# Patient Record
Sex: Female | Born: 2000 | Race: Black or African American | Hispanic: No | Marital: Single | Smoking: Never smoker
Health system: Southern US, Community
[De-identification: ages and names within clinical notes are randomized; demographics above are authoritative.]

## PROBLEM LIST (undated history)

## (undated) DIAGNOSIS — D649 Anemia, unspecified: Secondary | ICD-10-CM

## (undated) DIAGNOSIS — J45909 Unspecified asthma, uncomplicated: Secondary | ICD-10-CM

## (undated) HISTORY — PX: TYMPANOSTOMY TUBE PLACEMENT: SHX32

## (undated) HISTORY — DX: Unspecified asthma, uncomplicated: J45.909

---
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
## 2021-04-29 ENCOUNTER — Ambulatory Visit: Payer: Medicaid Other | Admitting: Family Medicine

## 2021-05-13 ENCOUNTER — Ambulatory Visit (INDEPENDENT_AMBULATORY_CARE_PROVIDER_SITE_OTHER): Payer: Medicaid Other | Admitting: Family Medicine

## 2021-05-13 ENCOUNTER — Encounter: Payer: Self-pay | Admitting: Family Medicine

## 2021-05-13 ENCOUNTER — Other Ambulatory Visit: Payer: Self-pay

## 2021-05-13 VITALS — BP 94/62 | HR 62 | Ht 60.0 in | Wt 106.4 lb

## 2021-05-13 DIAGNOSIS — N926 Irregular menstruation, unspecified: Secondary | ICD-10-CM | POA: Diagnosis not present

## 2021-05-13 DIAGNOSIS — Z3201 Encounter for pregnancy test, result positive: Secondary | ICD-10-CM

## 2021-05-13 LAB — POCT URINE PREGNANCY: Preg Test, Ur: POSITIVE — AB

## 2021-05-13 NOTE — Patient Instructions (Signed)
It is early in your pregnancy as I calculate yu at 8 weeks and zero days with an estimated due dateof Dec 23, 2021 First Trimester of Pregnancy The first trimester of pregnancy starts on the first day of your last menstrual period until the end of week 12. This is months 1 through 3 of pregnancy. A week after a sperm fertilizes an egg, the egg will implant into the wall of the uterus and begin to develop into a baby. By the end of 12 weeks, all the baby's organs will be formed and the baby will be 2-3 inches in size. Body changes during your first trimester Your body goes through many changes during pregnancy. The changes vary and generally return to normal after your baby is born. Physical changes You may gain or lose weight. Your breasts may begin to grow larger and become tender. The tissue that surrounds your nipples (areola) may become darker. Dark spots or blotches (chloasma or mask of pregnancy) may develop on your face. You may have changes in your hair. These can include thickening or thinning of your hair or changes in texture. Health changes You may feel nauseous, and you may vomit. You may have heartburn. You may develop headaches. You may develop constipation. Your gums may bleed and may be sensitive to brushing and flossing. Other changes You may tire easily. You may urinate more often. Your menstrual periods will stop. You may have a loss of appetite. You may develop cravings for certain kinds of food. You may have changes in your emotions from day to day. You may have more vivid and strange dreams. Follow these instructions at home: Medicines Follow your health care provider's instructions regarding medicine use. Specific medicines may be either safe or unsafe to take during pregnancy. Do not take any medicines unless told to by your health care provider. Take a prenatal vitamin that contains at least 600 micrograms (mcg) of folic acid. Eating and drinking Eat a healthy  diet that includes fresh fruits and vegetables, whole grains, good sources of protein such as meat, eggs, or tofu, and low-fat dairy products. Avoid raw meat and unpasteurized juice, milk, and cheese. These carry germs that can harm you and your baby. If you feel nauseous or you vomit: Eat 4 or 5 small meals a day instead of 3 large meals. Try eating a few soda crackers. Drink liquids between meals instead of during meals. You may need to take these actions to prevent or treat constipation: Drink enough fluid to keep your urine pale yellow. Eat foods that are high in fiber, such as beans, whole grains, and fresh fruits and vegetables. Limit foods that are high in fat and processed sugars, such as fried or sweet foods. Activity Exercise only as directed by your health care provider. Most people can continue their usual exercise routine during pregnancy. Try to exercise for 30 minutes at least 5 days a week. Stop exercising if you develop pain or cramping in the lower abdomen or lower back. Avoid exercising if it is very hot or humid or if you are at high altitude. Avoid heavy lifting. If you choose to, you may have sex unless your health care provider tells you not to. Relieving pain and discomfort Wear a good support bra to relieve breast tenderness. Rest with your legs elevated if you have leg cramps or low back pain. If you develop bulging veins (varicose veins) in your legs: Wear support hose as told by your health care provider. Elevate  your feet for 15 minutes, 3-4 times a day. Limit salt in your diet. Safety Wear your seat belt at all times when driving or riding in a car. Talk with your health care provider if someone is verbally or physically abusive to you. Talk with your health care provider if you are feeling sad or have thoughts of hurting yourself. Lifestyle Do not use hot tubs, steam rooms, or saunas. Do not douche. Do not use tampons or scented sanitary pads. Do not use  herbal remedies, alcohol, illegal drugs, or medicines that are not approved by your health care provider. Chemicals in these products can harm your baby. Do not use any products that contain nicotine or tobacco, such as cigarettes, e-cigarettes, and chewing tobacco. If you need help quitting, ask your health care provider. Avoid cat litter boxes and soil used by cats. These carry germs that can cause birth defects in the baby and possibly loss of the unborn baby (fetus) by miscarriage or stillbirth. General instructions During routine prenatal visits in the first trimester, your health care provider will do a physical exam, perform necessary tests, and ask you how things are going. Keep all follow-up visits. This is important. Ask for help if you have counseling or nutritional needs during pregnancy. Your health care provider can offer advice or refer you to specialists for help with various needs. Schedule a dentist appointment. At home, brush your teeth with a soft toothbrush. Floss gently. Write down your questions. Take them to your prenatal visits. Where to find more information American Pregnancy Association: americanpregnancy.org [employer]: https://www.todd-brady.net/ Office on [employer] Health: MightyReward.co.nz Contact a health care provider if you have: Dizziness. A fever. Mild pelvic cramps, pelvic pressure, or nagging pain in the abdominal area. Nausea, vomiting, or diarrhea that lasts for 24 hours or longer. A bad-smelling vaginal discharge. Pain when you urinate. Known exposure to a contagious illness, such as chickenpox, measles, Zika virus, HIV, or hepatitis. Get help right away if you have: Spotting or bleeding from your vagina. Severe abdominal cramping or pain. Shortness of breath or chest pain. Any kind of trauma, such as from a fall or a car crash. New or increased pain, swelling, or redness in an arm or  leg. Summary The first trimester of pregnancy starts on the first day of your last menstrual period until the end of week 12 (months 1 through 3). Eating 4 or 5 small meals a day rather than 3 large meals may help to relieve nausea and vomiting. Do not use any products that contain nicotine or tobacco, such as cigarettes, e-cigarettes, and chewing tobacco. If you need help quitting, ask your health care provider. Keep all follow-up visits. This is important. This information is not intended to replace advice given to you by your health care provider. Make sure you discuss any questions you have with your health care provider. Document Revised: 01/16/2020 Document Reviewed: 11/22/2019 Elsevier Patient Education  2022 [employer].

## 2021-05-13 NOTE — Progress Notes (Signed)
urine

## 2021-05-14 DIAGNOSIS — Z3201 Encounter for pregnancy test, result positive: Secondary | ICD-10-CM | POA: Insufficient documentation

## 2021-05-14 LAB — OBSTETRIC PANEL, INCLUDING HIV
Antibody Screen: NEGATIVE
Basophils Absolute: 0 10*3/uL (ref 0.0–0.2)
Basos: 1 %
EOS (ABSOLUTE): 0.1 10*3/uL (ref 0.0–0.4)
Eos: 1 %
HIV Screen 4th Generation wRfx: NONREACTIVE
Hematocrit: 37.1 % (ref 34.0–46.6)
Hemoglobin: 12.1 g/dL (ref 11.1–15.9)
Hepatitis B Surface Ag: NEGATIVE
Immature Grans (Abs): 0 10*3/uL (ref 0.0–0.1)
Immature Granulocytes: 0 %
Lymphocytes Absolute: 1.2 10*3/uL (ref 0.7–3.1)
Lymphs: 17 %
MCH: 30.4 pg (ref 26.6–33.0)
MCHC: 32.6 g/dL (ref 31.5–35.7)
MCV: 93 fL (ref 79–97)
Monocytes Absolute: 0.6 10*3/uL (ref 0.1–0.9)
Monocytes: 8 %
Neutrophils Absolute: 5.4 10*3/uL (ref 1.4–7.0)
Neutrophils: 73 %
Platelets: 339 10*3/uL (ref 150–450)
RBC: 3.98 x10E6/uL (ref 3.77–5.28)
RDW: 11.8 % (ref 11.7–15.4)
RPR Ser Ql: NONREACTIVE
Rh Factor: POSITIVE
Rubella Antibodies, IGG: 5.72 index (ref >0.99)
WBC: 7.3 10*3/uL (ref 3.4–10.8)

## 2021-05-14 LAB — HCV INTERPRETATION

## 2021-05-14 LAB — HCV AB W REFLEX TO QUANT PCR: HCV Ab: <0.1 s/co ratio (ref 0.0–0.9)

## 2021-05-14 NOTE — Progress Notes (Signed)
    CHIEF COMPLAINT / HPI: Missed menstrual cycle.  Last menstrual cycle was July 27.  She took home pregnancy test which was positive.  She has started on prenatal vitamins that she bought the drugstore.  She had previously smoked some marijuana but as soon as she missed her cycle she has discontinued that practice.  She is not smoking cigarettes.  She is not drinking alcohol or using illicit drugs.  She is not taking anything over-the-counter except occasional Tylenol.  She is here with her partner and they are wanting to have her pregnancy care at this clinic.  She specifically tells me that none of the information about her health status should be discussed with her family or parents.   PERTINENT  PMH / PSH: I have reviewed the patient's medications, allergies, past medical and surgical history, smoking status and updated in the EMR as appropriate.   OBJECTIVE:  BP 94/62   Pulse 62   Ht 5' (1.524 m)   Wt 106 lb 6.4 oz (48.3 kg)   LMP 03/18/2021   SpO2 100%   BMI 20.78 kg/m  GENERAL: Well-developed female no acute distress POSITIVE pregnancy test.PSYCH: AxOx4. Good eye contact.. No psychomotor retardation or agitation. Appropriate speech fluency and content. Asks and answers questions appropriately. Mood is congruent.   ASSESSMENT / PLAN:   Positive pregnancy test Gave her handout and discussed first trimester pregnancy care especially illicit substances over-the-counter medications nausea etc.  She already has her prenatal vitamins so I would continue those.  I have set her up for initial OB appointment at this clinic.  We will get her OB labs today.  Set her up for ultrasound.  Congratulated her and her partner.  Reassured her that we will keep any of her health information confidential as is the customer.   Denny Levy MD

## 2021-05-14 NOTE — Assessment & Plan Note (Signed)
Gave her handout and discussed first trimester pregnancy care especially illicit substances over-the-counter medications nausea etc.  She already has her prenatal vitamins so I would continue those.  I have set her up for initial OB appointment at this clinic.  We will get her OB labs today.  Set her up for ultrasound.  Congratulated her and her partner.  Reassured her that we will keep any of her health information confidential as is the customer.

## 2021-05-17 LAB — URINE CULTURE, OB REFLEX

## 2021-05-17 LAB — CULTURE, OB URINE

## 2021-05-18 LAB — HGB FRACTIONATION CASCADE
Hgb A2: 2.9 % (ref 1.8–3.2)
Hgb A: 97.1 % (ref 96.4–98.8)
Hgb F: 0 % (ref 0.0–2.0)
Hgb S: 0 %

## 2021-05-25 NOTE — Progress Notes (Deleted)
Treat UTI 

## 2021-05-26 ENCOUNTER — Other Ambulatory Visit: Payer: Self-pay | Admitting: Family Medicine

## 2021-05-26 ENCOUNTER — Other Ambulatory Visit: Payer: Self-pay

## 2021-05-26 ENCOUNTER — Telehealth: Payer: Self-pay | Admitting: Family Medicine

## 2021-05-26 ENCOUNTER — Ambulatory Visit
Admission: RE | Admit: 2021-05-26 | Discharge: 2021-05-26 | Disposition: A | Discharge status: Home / Self Care | Payer: Medicaid Other | Source: Ambulatory Visit | Attending: Family Medicine | Admitting: Family Medicine

## 2021-05-26 ENCOUNTER — Encounter: Payer: Medicaid Other | Admitting: Family Medicine

## 2021-05-26 DIAGNOSIS — Z3201 Encounter for pregnancy test, result positive: Secondary | ICD-10-CM

## 2021-05-26 DIAGNOSIS — O26891 Other specified pregnancy related conditions, first trimester: Secondary | ICD-10-CM | POA: Diagnosis not present

## 2021-05-26 DIAGNOSIS — Z3A10 10 weeks gestation of pregnancy: Secondary | ICD-10-CM | POA: Diagnosis not present

## 2021-05-26 MED ORDER — CEPHALEXIN 500 MG PO CAPS: 500.0000 mg | ORAL_CAPSULE | Freq: Three times a day (TID) | ORAL | 0 refills | Status: DC

## 2021-05-26 NOTE — Telephone Encounter (Signed)
Called patient to discuss missed initial prenatal visit.  She reports that she was unaware that she had an appointment.  She discussed that she was upset about events that occurred in her ultrasound.  We discussed that she did have a positive urine culture and needs to be treated for this for asymptomatic bacteria in pregnancy.  I sent a prescription for Keflex 3 times a day for 7 days to her pharmacy.  Patient reports she will go pick it up.  Verified correct pharmacy with the patient.

## 2021-06-10 ENCOUNTER — Encounter: Payer: Self-pay | Admitting: Family Medicine

## 2021-06-10 ENCOUNTER — Ambulatory Visit (INDEPENDENT_AMBULATORY_CARE_PROVIDER_SITE_OTHER): Payer: Medicaid Other | Admitting: Family Medicine

## 2021-06-10 ENCOUNTER — Other Ambulatory Visit (HOSPITAL_COMMUNITY)
Admission: RE | Admit: 2021-06-10 | Discharge: 2021-06-10 | Disposition: A | Discharge status: Home / Self Care | Payer: Medicaid Other | Source: Ambulatory Visit | Attending: Family Medicine | Admitting: Family Medicine

## 2021-06-10 ENCOUNTER — Other Ambulatory Visit: Payer: Self-pay

## 2021-06-10 VITALS — BP 102/58 | HR 88 | Wt 107.8 lb

## 2021-06-10 DIAGNOSIS — Z3401 Encounter for supervision of normal first pregnancy, first trimester: Secondary | ICD-10-CM

## 2021-06-10 DIAGNOSIS — Z3491 Encounter for supervision of normal pregnancy, unspecified, first trimester: Secondary | ICD-10-CM | POA: Insufficient documentation

## 2021-06-10 DIAGNOSIS — N898 Other specified noninflammatory disorders of vagina: Secondary | ICD-10-CM | POA: Diagnosis not present

## 2021-06-10 DIAGNOSIS — Z3492 Encounter for supervision of normal pregnancy, unspecified, second trimester: Secondary | ICD-10-CM | POA: Insufficient documentation

## 2021-06-10 DIAGNOSIS — Z3493 Encounter for supervision of normal pregnancy, unspecified, third trimester: Secondary | ICD-10-CM | POA: Insufficient documentation

## 2021-06-10 LAB — POCT WET PREP (WET MOUNT)
Clue Cells Wet Prep Whiff POC: NEGATIVE
Trichomonas Wet Prep HPF POC: ABSENT

## 2021-06-10 MED ORDER — ASPIRIN EC 81 MG PO TBEC: 81.0000 mg | DELAYED_RELEASE_TABLET | Freq: Every day | ORAL | 11 refills | Status: DC

## 2021-06-10 MED ORDER — TERCONAZOLE 0.4 % VA CREA: 1.0000 | TOPICAL_CREAM | Freq: Every day | VAGINAL | 0 refills | Status: DC

## 2021-06-10 NOTE — Progress Notes (Signed)
Patient Name: Tammie Edwards Date of Birth: 05/27/2001 [place] Initial Prenatal Visit  Tammie Edwards is a 20 y.o. year old No obstetric history on file. at Unknown who presents for her initial prenatal visit. Pregnancy is planned She reports breast tenderness, frequent urination, and positive home pregnancy test. She is taking a prenatal vitamin.  She denies pelvic pain or vaginal bleeding.   Pregnancy Dating: The patient is dated by LMP.  LMP: 38/25/0539 Period is certain:  Yes.  Periods were regular:  Yes.  LMP was a typical period:  Yes.  Using hormonal contraception in 3 months prior to conception: No  Lab Review: Blood type: O Rh Status: + Antibody screen: Negative HIV: Negative RPR: Negative Hemoglobin electrophoresis reviewed: Yes Results of OB urine culture are: Positive for E coli and was treated. TOC obtained today.  Rubella: Immune Hep C Ab: Negative Varicella status is Unknown  PMH: Reviewed and as detailed below: HTN: No  Gestational Hypertension/preeclampsia: No  Type 1 or 2 Diabetes: No  Depression:  Yes , is not on medications for it.  Seizure disorder:  No VTE: No ,  History of STI No,  Abnormal Pap smear:  No, Has had one at age 29 y/o which was normal Genital herpes simplex:  No   PSH: Gynecologic Surgery:  no Surgical history reviewed, notable for: tympanostomy tubes   Obstetric History: Obstetric history tab updated and reviewed.  Summary of prior pregnancies: NA Cesarean delivery: No  Gestational Diabetes:  No Hypertension in pregnancy: No History of preterm birth: No History of LGA/SGA infant:  No History of shoulder dystocia: No Indications for referral were reviewed, and the patient has no obstetric indications for referral to [facility] at this time.   Social History: Partner's name: Einar Crow   Tobacco use: No Alcohol use:  No Other substance use:  No, marijuana prior to pregnancy but quit after  missed period   Current Medications:  Prenatal Vitamin   Reviewed and appropriate in pregnancy.   Genetic and Infection Screen: Flow Sheet Updated Yes  Prenatal Exam: Gen: Well nourished, well developed.  No distress.  Vitals noted. HEENT: Normocephalic, atraumatic.  Neck supple without cervical lymphadenopathy, thyromegaly or thyroid nodules.  Fair dentition. CV: RRR no murmur, gallops or rubs Lungs: CTA B.  Normal respiratory effort without wheezes or rales. Abd: soft, NTND. +BS.  Uterus not appreciated above pelvis. GU: Normal external female genitalia without lesions.  Nl vaginal, well rugated without lesions.  Moderate thick vaginal discharge  Ext: No clubbing, cyanosis or edema. Psych: Normal grooming and dress.  Not depressed or anxious appearing.  Normal thought content and process without flight of ideas or looseness of associations  Fetal heart tones: Appropriate 152 bpm  Assessment/Plan:  Tammie Edwards is a 20 y.o. No obstetric history on file. at Unknown who presents to initiate prenatal care. She is doing well.  Current pregnancy issues include none.  Routine prenatal care: As dating is reliable, a dating ultrasound has been completed Dating tab updated. Pre-pregnancy weight updated. Expected weight gain this pregnancy is 25-35 pounds  Prenatal labs reviewed, notable for E Coli UTI treated and TOC obtained today. Indications for referral to [facility] were reviewed and the patient does not meet criteria for referral.  Medication list reviewed and updated.  Recommended patient see a dentist for regular care.  Bleeding and pain precautions reviewed. Importance of prenatal vitamins reviewed.  Genetic screening offered. Patient opted for: no  screening. The patient has the following indications for aspirinto begin 81 mg at 12-16 weeks: One high risk condition: no single high risk condition  MORE than one moderate risk condition: nulliparity and identifies as African American   Aspirin was  recommended today based upon above risk factors (one high risk condition or more than one moderate risk factor)  The patient will not be age 66 or over at time of delivery. Referral to genetic counseling was not offered today.  The patient has the following risk factors for preexisting diabetes: Reviewed indications for early 1 hour glucose testing, not indicated . An early 1 hour glucose tolerance test was not ordered. Pregnancy Medical Home and PHQ-9 forms completed, problems noted: No  2. Pregnancy issues include the following which were addressed today:  GC/chlamydia collected today, will send patient a MyChart message if they are normal and call the patient if they are abnormal. During pelvic exam noted thick vaginal discharge, wet prep positive for yeast infection, treated with terconazole for 7 days Initial OB urine culture grew E. coli, she was treated and completed her treatment 1 week ago, test of cure collected today Patient met criteria for 81 mg aspirin due to nulliparity and African-American, prescription sent to patient's pharmacy Plan on scheduling patient's anatomy scan at next visit    Follow up scheduled in 4 weeks for next prenatal visit.

## 2021-06-10 NOTE — Patient Instructions (Addendum)
It was wonderful to meet you today.  Congratulations on the pregnancy!  I have no concerns at this time.  Had I recommend you continue taking your prenatal vitamin as well as starting aspirin.  I have sent a prescription for this to your pharmacy.  We have scheduled you for an appointment in 1 month which will be your next OB appointment.  At that visit we will schedule your anatomy ultrasound.  We ran a a few routine tests today and I will send those results to your MyChart but if there are any abnormalities I will call you.  If you have any vaginal bleeding, gush of fluid, feel contractions please go be assessed at the maternal assessment unit in the bottom of the [facility].  If you have any questions please feel free to call the clinic.  I hope you have a wonderful afternoon and I will see you in a month.  Safe Medications in Pregnancy   Acne:  Benzoyl Peroxide  Salicylic Acid   Backache/Headache:  Tylenol: 2 regular strength every 4 hours OR               2 Extra strength every 6 hours   Colds/Coughs/Allergies:  Benadryl (alcohol free) 25 mg every 6 hours as needed  Breath right strips  Claritin  Cepacol throat lozenges  Chloraseptic throat spray  Cold-Eeze- up to three times per day  Cough drops, alcohol free  Flonase (by prescription only)  Guaifenesin  Mucinex  Robitussin DM (plain only, alcohol free)  Saline nasal spray/drops  Sudafed (pseudoephedrine) & Actifed * use only after [redacted] weeks gestation and if you do not have high blood pressure  Tylenol  Vicks Vaporub  Zinc lozenges  Zyrtec   Constipation:  Colace  Ducolax suppositories  Fleet enema  Glycerin suppositories  Metamucil  Milk of magnesia  Miralax  Senokot  Smooth move tea   Diarrhea:  Kaopectate  Imodium A-D   *NO pepto Bismol   Hemorrhoids:  Anusol  Anusol HC  Preparation H  Tucks   Indigestion:  Tums  Maalox  Mylanta  Zantac  Pepcid   Insomnia:  Benadryl (alcohol free) 25mg  every  6 hours as needed  Tylenol PM  Unisom, no Gelcaps   Leg Cramps:  Tums  MagGel   Nausea/Vomiting:  Bonine  Dramamine  Emetrol  Ginger extract  Sea bands  Meclizine  Nausea medication to take during pregnancy:  Unisom (doxylamine succinate 25 mg tablets) Take one tablet daily at bedtime. If symptoms are not adequately controlled, the dose can be increased to a maximum recommended dose of two tablets daily (1/2 tablet in the morning, 1/2 tablet mid-afternoon and one at bedtime).  Vitamin B6 100mg  tablets. Take one tablet twice a day (up to 200 mg per day).   Skin Rashes:  Aveeno products  Benadryl cream or 25mg  every 6 hours as needed  Calamine Lotion  1% cortisone cream   Yeast infection:  Gyne-lotrimin 7  Monistat 7    **If taking multiple medications, please check labels to avoid duplicating the same active ingredients  **take medication as directed on the label  ** Do not exceed 4000 mg of tylenol in 24 hours  **Do not take medications that contain aspirin or ibuprofen

## 2021-06-11 LAB — CERVICOVAGINAL ANCILLARY ONLY
Chlamydia: NEGATIVE
Comment: NEGATIVE
Comment: NORMAL
Neisseria Gonorrhea: NEGATIVE

## 2021-06-13 LAB — URINE CULTURE, OB REFLEX

## 2021-06-13 LAB — CULTURE, OB URINE

## 2021-06-23 NOTE — Progress Notes (Deleted)
  Patient Name: Tammie Edwards Date of Birth: 11-01-2000 [facility] Prenatal Visit  DONNETTA GILLIN is a 20 y.o. G1P0 at [redacted]w[redacted]d here for routine follow up. She is dated by LMP.  She reports {symptoms; pregnancy related:14538}.  She denies vaginal bleeding.  See flow sheet for details.  There were no vitals filed for this visit.   A/P: Pregnancy at [redacted]w[redacted]d.  Doing well.    Routine Prenatal Care:  Dating reviewed, dating tab is {correct:23336::"correct"} Fetal heart tones {appropriate:23337} Influenza vaccine {given:23340}  COVID vaccination was discussed and ***.  The patient has the following indication for screening preexisting diabetes: Reviewed indications for early 1 hour glucose testing, not indicated . Anatomy ultrasound ordered to be scheduled at 18-20 weeks. Patient {is/is not:9024} interested in genetic screening. As she is past 13 weeks and 6 days, a {quad:23339::"Quad screen "} was offered.  Pregnancy education including expected weight gain in pregnancy, OTC medication use, continued use of prenatal vitamin, smoking cessation if applicable, and nutrition in pregnancy.   Bleeding and pain precautions reviewed. The patient has the following indications for aspirin to begin 81 mg at 12-16 weeks: One high risk condition: no single high risk condition  MORE than one moderate risk condition: nulliparity and identifies as African American  Aspirin was  recommended today based upon above risk factors (one high risk condition or more than one moderate risk factor) - already prescribed  2. Pregnancy issues include the following and were addressed as appropriate today:  Vaginal candidiasis: patient had yeast on wet prep at visit 2 weeks ago, treated with terconazole. Today she reoports*** Asymptomatic GBS bacteriuria found on initial OB urine culture. Patient completed tx with keflex. Repeat OB urine culture obtained today for TOC. However, she will need ppx abx at time of  delivery. Problem list  and pregnancy box updated: {yes/no:20286::"Yes"}.   Follow up 2 weeks.

## 2021-07-10 ENCOUNTER — Ambulatory Visit (INDEPENDENT_AMBULATORY_CARE_PROVIDER_SITE_OTHER): Payer: Medicaid Other | Admitting: Family Medicine

## 2021-07-10 ENCOUNTER — Other Ambulatory Visit: Payer: Self-pay

## 2021-07-10 VITALS — BP 100/61 | HR 85 | Wt 110.2 lb

## 2021-07-10 DIAGNOSIS — Z3401 Encounter for supervision of normal first pregnancy, first trimester: Secondary | ICD-10-CM

## 2021-07-10 NOTE — Patient Instructions (Signed)
It was wonderful seeing you today.  I am glad you are doing well and I have no concerns at this time.  We scheduled your anatomy ultrasound.  You will need to be seen in our clinic in 4 weeks.  If you have any bleeding, contractions, gush of fluid between now and then please seek medical attention at the maternal assessment unit.  If you have any questions or concerns call the clinic.  I hope you have a wonderful afternoon!

## 2021-07-10 NOTE — Progress Notes (Signed)
  Patient Name: Tammie Edwards Date of Birth: 04/07/2001 [facility] Prenatal Visit  Tammie Edwards is a 20 y.o. G1P0 at [redacted]w[redacted]d here for routine follow up. She is dated by LMP.  She reports no bleeding, no contractions, no cramping, and no leaking.  She denies vaginal bleeding.  See flow sheet for details.  Vitals:   07/10/21 1453  BP: 100/61  Pulse: 85     A/P: Pregnancy at 108w2d.  Doing well.    Routine Prenatal Care:  Dating reviewed, dating tab is correct Fetal heart tones Appropriate 150 Influenza vaccine not administered as patient declined, will continue to discuss.   COVID vaccination was discussed and patient has decided not to get it at this time.  We will continue to discuss..  The patient has the following indication for screening preexisting diabetes: Reviewed indications for early 1 hour glucose testing, not indicated . Anatomy ultrasound ordered to be scheduled at 18-20 weeks. Patient is not interested in genetic screening.  Pregnancy education including expected weight gain in pregnancy, OTC medication use, continued use of prenatal vitamin, smoking cessation if applicable, and nutrition in pregnancy.   Bleeding and pain precautions reviewed. The patient has the following indications for aspirinto begin 81 mg at 12-16 weeks: One high risk condition: no single high risk condition  MORE than one moderate risk condition: nulliparity and identifies as African American  Aspirin was  recommended today based upon above risk factors (one high risk condition or more than one moderate risk factor)   2. Pregnancy issues include the following and were addressed as appropriate today:  Low risk pregnancy, next visit scheduled for 12/19 Anatomy scan scheduled for 12/9 Patient not yet taking aspirin, recommended to take aspirin daily for PE prevention. Counseled on flu vaccine and patient declined, will continue to have this conversation Counseled on COVID-19  booster and patient declined we will continue to discuss at future visits Patient will need to be scheduled for second trimester faculty OB clinic at next visit GBS bacteria-patient will need prophylaxis during labor Problem list  and pregnancy box updated: Yes.   Follow up 4 weeks.

## 2021-07-31 ENCOUNTER — Other Ambulatory Visit: Payer: Self-pay | Admitting: Family Medicine

## 2021-07-31 ENCOUNTER — Other Ambulatory Visit: Payer: Self-pay

## 2021-07-31 ENCOUNTER — Ambulatory Visit: Payer: Medicaid Other | Attending: Family Medicine

## 2021-07-31 DIAGNOSIS — Z3A19 19 weeks gestation of pregnancy: Secondary | ICD-10-CM | POA: Diagnosis not present

## 2021-07-31 DIAGNOSIS — Z363 Encounter for antenatal screening for malformations: Secondary | ICD-10-CM | POA: Insufficient documentation

## 2021-07-31 DIAGNOSIS — Z3401 Encounter for supervision of normal first pregnancy, first trimester: Secondary | ICD-10-CM | POA: Insufficient documentation

## 2021-07-31 DIAGNOSIS — O35BXX0 Maternal care for other (suspected) fetal abnormality and damage, fetal cardiac anomalies, not applicable or unspecified: Secondary | ICD-10-CM | POA: Insufficient documentation

## 2021-07-31 DIAGNOSIS — Z3689 Encounter for other specified antenatal screening: Secondary | ICD-10-CM | POA: Diagnosis not present

## 2021-08-10 ENCOUNTER — Encounter: Payer: Medicaid Other | Admitting: Family Medicine

## 2021-08-10 NOTE — Progress Notes (Deleted)
°  Patient Name: Tammie Edwards Date of Birth: Apr 28, 2001 [facility] Prenatal Visit  Tammie Edwards is a 20 y.o. G1P0 at 103w5d here for routine follow up. She is dated by LMP.  She reports {symptoms; pregnancy related:14538}.  She denies vaginal bleeding.  See flow sheet for details.  There were no vitals filed for this visit.   A/P: Pregnancy at [redacted]w[redacted]d.  Doing well.    Routine Prenatal Care:  Dating reviewed, dating tab is correct Fetal heart tones {appropriate:23337} Influenza vaccine {given:23340}  COVID vaccination was discussed and ***.  The patient has the following indication for screening preexisting diabetes: Reviewed indications for early 1 hour glucose testing, not indicated . Anatomy ultrasound ordered to be scheduled at 18-20 weeks. Patient is not interested in genetic screening. Pregnancy education including expected weight gain in pregnancy, OTC medication use, continued use of prenatal vitamin, smoking cessation if applicable, and nutrition in pregnancy.   Bleeding and pain precautions reviewed. The patient has the following indications for aspirinto begin 81 mg at 12-16 weeks: One high risk condition: {fmcaspirinobhigh:26167} MORE than one moderate risk condition: {fmcaspirinobmoderate:26168} Aspirin {WAS/WAS [phone]::"was not"}  recommended today based upon above risk factors (one high risk condition or more than one moderate risk factor)   2. Pregnancy issues include the following and were addressed as appropriate today:  Echogenic intracardiac foci noticed on anatomy scan.  Patient was counseled by maternal-fetal medicine doctors regarding further testing and they declined any further testing including cell free DNA. Patient to continue taking aspirin daily for preeclampsia prevention.   Patient scheduled for second trimester OB faculty clinic Patient has GBS bacteriuria and will need prophylaxis during labor. Problem list  and pregnancy box  updated: {yes/no:20286::"Yes"}.   Follow up 4 weeks.

## 2021-08-23 NOTE — L&D Delivery Note (Addendum)
OB/GYN Faculty Practice Delivery Note ? ?Tammie Edwards is a 21 y.o. G1P0 s/p SVD at [redacted]w[redacted]d. She was admitted for SOL.  ? ?ROM: 0h 27m with clear fluid ?GBS Status:  Positive/-- (04/28 0000) ?Maximum Maternal Temperature:  Temp (24hrs), Avg:98.1 ?F (36.7 ?C), Min:97.8 ?F (36.6 ?C), Max:98.6 ?F (37 ?C) ? ?Labor Progress: ?Patient arrived at 2 cm dilation. She then progressed to complete largely with expectant management and AROM at 9cm.  ? ?Delivery Date/Time: 12/18/2021 at 2239 ?Delivery: Called to room and patient was complete and pushing. Pushed for about 15 minutes. Head delivered in LOA position. No nuchal cord present. Shoulder and body delivered in usual fashion. Infant with spontaneous cry, placed on mother's abdomen, dried and stimulated. Cord clamped x 2 after 1-minute delay, and cut by FOB. Cord blood drawn. Placenta delivered spontaneously with gentle cord traction. Fundus firm with massage and Pitocin. Labia, perineum, vagina, and cervix inspected with left labial tear.  ? ?Placenta: spontaneous, intact, 3 vessel cord  ?Complications: none  ?Lacerations: left labial repaired with 4-0 Monocryl ?EBL: 75 ml  ?Analgesia: epidural   ? ?Infant: ?APGAR (1 MIN): 9   ?APGAR (5 MINS): 9   ? ?Weight: pending  ? ?Derrel Nip, MD  ?PGY-3, [facility]  ?12/18/2021 11:03 PM ? ?ATTESTATION ? ?I was present, gloved, and supervising throughout the delivery and agree with above documentation in the resident's note. ? ?Allayne Stack, DO ?OB Fellow  ?[facility] [employer] [facility]) ?12/18/2021, 11:09 PM   ?

## 2021-08-27 ENCOUNTER — Other Ambulatory Visit: Payer: Self-pay

## 2021-08-27 ENCOUNTER — Ambulatory Visit (INDEPENDENT_AMBULATORY_CARE_PROVIDER_SITE_OTHER): Payer: Medicaid Other | Admitting: Family Medicine

## 2021-08-27 DIAGNOSIS — Z3402 Encounter for supervision of normal first pregnancy, second trimester: Secondary | ICD-10-CM

## 2021-08-27 NOTE — Progress Notes (Signed)
°  [place] Prenatal Visit  Tammie Edwards is a 21 y.o. G1P0 at [redacted]w[redacted]d here for routine follow up. She is dated by LMP.  She reports no bleeding, no contractions, no cramping, and no leaking.  She reports good fetal movement. No bleeding, loss of fluid, contractions. See flow sheet for details. Vitals:   08/27/21 1613  BP: 111/66  Pulse: 74     A/P: Pregnancy at [redacted]w[redacted]d.  Doing well.   Dating reviewed, dating tab is correct Fetal heart tones Appropriate 150 Fundal height within expected range.  22 cm Anatomy ultrasound reviewed and notable for EIF.  Influenza vaccine not administered as patient declined, will continue to discuss. Marland Kitchen  COVID vaccination was discussed and declined at this time.  Indications for screening for preexisting diabetes include: Reviewed indications for early 1 hour glucose testing, not indicated .  Pregnancy education provided on the following topics: fetal growth and movement, ultrasound assessment, and upcoming laboratory assessment.   The patient has the following indications for aspirinto begin 81 mg at 12-16 weeks: One high risk condition: no single high risk condition  MORE than one moderate risk condition: nulliparity and identifies as African American  Aspirin was  recommended today based upon above risk factors (one high risk condition or more than one moderate risk factor)  Scheduled for Faculty Ob Clinic during second trimester on 09/10/21. Preterm labor precautions given.   2. Pregnancy issues include the following and were addressed as appropriate today:   Low risk pregnancy, scheduled for faculty OB visit on 1/19 Anatomy scan has been completed, EIF on anatomy scan, parents declined further testing Patient currently taking aspirin as well as prenatal vitamin, encouraged to continue these Counseled patient on the flu vaccine and patient declined, will continue to discuss Counseled patient on the COVID-vaccine and they declined, will  continue to discuss GBS bacteria-patient will need prophylaxis during labor  Problem list and pregnancy box updated: Yes.     Follow up 4 weeks.

## 2021-08-27 NOTE — Patient Instructions (Signed)
It was wonderful seeing you today!  I am glad everything is going well.  I have no concerns at this time.  Please continue taking your prenatal vitamin as well as your 81 mg aspirin daily.  If you have any vaginal bleeding, contractions, gush of fluid please be evaluated at the maternal assessment unit in the bottom of the [facility].  You are scheduled for your next appointment on 1/19 with her [facility] faculty clinic.  You will need your 1 hour glucose tolerance test at that visit so please be sure to show up a little early so that they can complete that that day.  If you have any questions or concerns call the clinic.  I hope you have a wonderful afternoon!

## 2021-08-28 ENCOUNTER — Encounter: Payer: Self-pay | Admitting: Family Medicine

## 2021-08-28 DIAGNOSIS — O283 Abnormal ultrasonic finding on antenatal screening of mother: Secondary | ICD-10-CM | POA: Insufficient documentation

## 2021-09-10 ENCOUNTER — Ambulatory Visit (INDEPENDENT_AMBULATORY_CARE_PROVIDER_SITE_OTHER): Payer: Medicaid Other | Admitting: Family Medicine

## 2021-09-10 ENCOUNTER — Other Ambulatory Visit: Payer: Self-pay

## 2021-09-10 DIAGNOSIS — O99891 Other specified diseases and conditions complicating pregnancy: Secondary | ICD-10-CM

## 2021-09-10 DIAGNOSIS — R8271 Bacteriuria: Secondary | ICD-10-CM

## 2021-09-10 DIAGNOSIS — J4520 Mild intermittent asthma, uncomplicated: Secondary | ICD-10-CM

## 2021-09-10 DIAGNOSIS — Z3402 Encounter for supervision of normal first pregnancy, second trimester: Secondary | ICD-10-CM

## 2021-09-10 DIAGNOSIS — J45909 Unspecified asthma, uncomplicated: Secondary | ICD-10-CM | POA: Insufficient documentation

## 2021-09-10 NOTE — Progress Notes (Signed)
°  [place] Prenatal Visit  Tammie Edwards is a 21 y.o. G1P0 at [redacted]w[redacted]d here for routine follow up. She is dated by LMP.  She reports no complaints. She reports fetal movement. Denies vaginal bleeding, loss of fluid, or contractions.  See flow sheet for details.  A/P: Pregnancy at [redacted]w[redacted]d.  Doing well.   Dating reviewed, dating tab is correct Fetal heart tones Appropriate Fundal height within expected range.  Influenza vaccine not administered as patient declined, will continue to discuss.  Patient reports a history of allergic reaction to flu vaccination.  COVID vaccination was discussed and patient reports having previously received vaccination and booster.  Screening for gestational diabetes to be completed at 28wk visit  Pregnancy education completed including: fetal growth, breastfeeding, contraception, and expected weight gain in pregnancy.   The patient does not have a history of Cesarean delivery and no referral to Center for [facility] is indicated Preterm labor, bleeding, and pain precautions given.    2. Pregnancy issues include the following and were addressed as appropriate today:   Discussed finding of EICF on [place] and pt declines further genetic screening        Will need GTT, CBC, RPR, HIV and Tdap at 28wk visit  Problem list and pregnancy box updated: Yes.   Follow up 4 weeks.

## 2021-09-10 NOTE — Patient Instructions (Signed)
Pregnancy Related Return Precautions The follow are signs/symptoms that are abnormal in pregnancy and may require further evaluation by a physician: Go to the MAU at [facility] if: You have cramping/contractions that do not go away with drinking water, especially if they are lasting 30 seconds to 1.5 minutes, coming and going every 5-10 minutes for an hour or more, or are getting stronger and you cannot walk or talk while having a contraction/cramp. Your water breaks.  Sometimes it is a big gush of fluid, sometimes it is just a trickle that keeps getting your underwear wet or running down your legs You have vaginal bleeding.    You do not feel your baby moving like normal.  If you do not, get something to eat and drink (something cold or something with sugar like peanut butter or juice) and lay down and focus on feeling your baby move. If your baby is still not moving like normal, you should go to MAU. You should feel your baby move 6 times in one hour, or 10 times in two hours. You have a persistent headache that does not go away with 1 g of Tylenol, vision changes, chest pain, difficulty breathing, severe pain in your right upper abdomen, worsening leg swelling- these can all be signs of high blood pressure in pregnancy and need to be evaluated by a provider immediately  These are all concerning in pregnancy and if you have any of these I recommend you call your PCP and present to the Maternity Admissions Unit (map below) for further evaluation.  For any pregnancy-related emergencies, please go to the Maternity Admissions Unit in the [facility]. You will use hospital Entrance C.    

## 2021-10-20 ENCOUNTER — Encounter: Payer: Self-pay | Admitting: Family Medicine

## 2021-10-30 ENCOUNTER — Ambulatory Visit (INDEPENDENT_AMBULATORY_CARE_PROVIDER_SITE_OTHER): Payer: Medicaid Other | Admitting: Family Medicine

## 2021-10-30 ENCOUNTER — Other Ambulatory Visit: Payer: Self-pay

## 2021-10-30 VITALS — BP 100/60 | HR 74 | Wt 126.0 lb

## 2021-10-30 DIAGNOSIS — Z3402 Encounter for supervision of normal first pregnancy, second trimester: Secondary | ICD-10-CM | POA: Diagnosis not present

## 2021-10-30 DIAGNOSIS — O26843 Uterine size-date discrepancy, third trimester: Secondary | ICD-10-CM | POA: Diagnosis not present

## 2021-10-30 DIAGNOSIS — Z23 Encounter for immunization: Secondary | ICD-10-CM | POA: Diagnosis not present

## 2021-10-30 LAB — POCT 1 HR PRENATAL GLUCOSE: Glucose 1 Hr Prenatal, POC: 112 mg/dL

## 2021-10-30 NOTE — Patient Instructions (Addendum)
It was wonderful to meet you today. Thank you for allowing me to be a part of your care. Below is a short summary of what we discussed at your visit today: ? ?Prenatal care ?Your next visit will be in 2 weeks. I have scheduled many of your necessary future OB appointments. See the next page of this paperwork for appointment details. Please call the front desk if any of the days or times do not work for you.  ? ?We scheduled you for your growth check ultrasound today. The nurse will give you your appointment card before you leave. The information will be available on your MyChart app, too.  ? ?Prenatal Classes ?Go to [email or web address] for more information on the pregnancy and child birth classes that [place] has to offer.  ? ?Emergency Planning ?If you experience any vaginal bleeding, leakage of fluids, don't feel your baby moving as much, or start to have contractions less than 5 minutes apart please go directly to the Maternal Assessment Unit at [facility] for evaluation. ? ?Maternity and women's care services located on the [place] side of The [place]. [facility] (Entrance C off [address]  ?[address] Entrance C ?[place],  [place]  [address] ? ?  ? ?Maternal Mental Health ?If you start to develop the below symptoms of depression, please reach out to [place] for an appointment. There is also a Biomedical scientist Health Hotline at [phone]). This hotline has trained counselors, doulas, and midwifes to real-time support, information, and resources.  ?Feeling sad or hopeless most of the time ?Lack of interest in things you used to enjoy ?Less interest in caring for yourself (dressing, fixing hair) ?Trouble concentrating ?Trouble coping with daily tasks ?Constant worry about your baby ?Sleeping or eating too much or too little ?Feeling very anxious or nervous ?Unexplained irritability or anger ?Unwanted or scary  thoughts ?Feeling that you are not a good mother ?Thoughts of hurting yourself or your baby ? ?If you feel you are experiencing a mental health crisis, please reach out to the National Suicide Prevention Hotline at 1-800-273-TALK (702)185-4573) or go directly to the [facility], open 24/7.  ?[phone] ?[address], [place], [place] [address]  ? ?Please bring all of your medications to every appointment! ? ?If you have any questions or concerns, please do not hesitate to contact us via phone or MyChart message.  ? ?Fayette Pho, MD  ?

## 2021-10-30 NOTE — Progress Notes (Signed)
?  [facility] Prenatal Visit ? ?MONICA CODD is a 21 y.o. G1P0 at [redacted]w[redacted]d here for routine follow up. She is dated by LMP.  She reports no complaints.  She reports fetal movement. She denies vaginal bleeding, contractions, or loss of fluid.  See flow sheet for details. ? ?Vitals:  ? 10/30/21 0836  ?BP: 100/60  ?Pulse: 74  ? ?A/P: Pregnancy at [redacted]w[redacted]d.  Doing well.   ?Routine prenatal care:  ?Dating reviewed, dating tab is correct ?Fetal heart tones: Appropriate - 150s bpm ?Fundal height: >2 cm from expected size given dating, discussed with preceptor.  Measured 28-29 cm but currently at 32.[redacted] weeks gestation. Will order [place].  ?The patient does not have a history of HSV and valacyclovir is not indicated at this time.  ?The patient does not have a history of Cesarean delivery and no referral to Center for [facility] is indicated ?Infant feeding choice: Both  ?Contraception choice: Undecided  ?Infant circumcision desired not applicable ?Influenza vaccine not administered as patient declined, will continue to discuss.   ?Tdap was given today. ?COVID vaccination was discussed and declined by patient.  ?Childbirth and education classes were offered, see hand out for more.  ?Pregnancy education regarding benefits of breastfeeding, contraception, fetal growth, expected weight gain, and safe infant sleep were discussed.  ?Preterm labor and fetal movement precautions reviewed. ? ? ?2. Pregnancy issues include the following and were addressed as appropriate today: ? Not taking aspirin as prescribed. Patient declined discussion.  ?Is taking prenatal vitamin ?Declines COVID and flu today ?Did get TDaP today ?Today did 1 hour GGT ?Drew CBC, HIV, RPR ?Ordered growth [place] for size-date discrepancy ? ? Problem list and pregnancy box updated: Yes.  ? ?Scheduled for Ob Faculty clinic in third trimester on 11/26/2021.  ? ?Follow up 2 weeks. All future prenatal visits through [redacted] weeks gestation scheduled today. May clinic  schedule not yet available for booking.  ? ?Fayette Pho, MD ? ?

## 2021-10-30 NOTE — Assessment & Plan Note (Signed)
Measured 28-29 cm but currently at 32.[redacted] weeks gestation. Will order [place].  ?

## 2021-10-31 LAB — HIV ANTIBODY (ROUTINE TESTING W REFLEX): HIV Screen 4th Generation wRfx: NONREACTIVE

## 2021-10-31 LAB — CBC
Hematocrit: 28.4 % — ABNORMAL LOW (ref 34.0–46.6)
Hemoglobin: 9.5 g/dL — ABNORMAL LOW (ref 11.1–15.9)
MCH: 29.6 pg (ref 26.6–33.0)
MCHC: 33.5 g/dL (ref 31.5–35.7)
MCV: 89 fL (ref 79–97)
Platelets: 237 10*3/uL (ref 150–450)
RBC: 3.21 x10E6/uL — ABNORMAL LOW (ref 3.77–5.28)
RDW: 12.1 % (ref 11.7–15.4)
WBC: 6.6 10*3/uL (ref 3.4–10.8)

## 2021-10-31 LAB — RPR: RPR Ser Ql: NONREACTIVE

## 2021-11-02 ENCOUNTER — Other Ambulatory Visit: Payer: Self-pay | Admitting: Family Medicine

## 2021-11-02 ENCOUNTER — Encounter: Payer: Self-pay | Admitting: Family Medicine

## 2021-11-02 DIAGNOSIS — O99013 Anemia complicating pregnancy, third trimester: Secondary | ICD-10-CM

## 2021-11-02 MED ORDER — FERROUS SULFATE 325 (65 FE) MG PO TABS: 325.0000 mg | ORAL_TABLET | Freq: Every day | ORAL | 1 refills | Status: DC

## 2021-11-02 NOTE — Progress Notes (Signed)
Hgb 9.5, dx anemia. Rx ferrous sulfate 325 mg daily.  ?Ezequiel Essex, MD ? ?

## 2021-11-05 ENCOUNTER — Encounter: Payer: Self-pay | Admitting: Family Medicine

## 2021-11-05 ENCOUNTER — Encounter: Payer: Self-pay | Admitting: *Deleted

## 2021-11-05 ENCOUNTER — Ambulatory Visit: Payer: Medicaid Other | Attending: Family Medicine

## 2021-11-05 ENCOUNTER — Ambulatory Visit: Payer: Medicaid Other | Admitting: *Deleted

## 2021-11-05 ENCOUNTER — Other Ambulatory Visit: Payer: Self-pay

## 2021-11-05 VITALS — BP 108/71 | HR 77

## 2021-11-05 DIAGNOSIS — Z3A33 33 weeks gestation of pregnancy: Secondary | ICD-10-CM | POA: Insufficient documentation

## 2021-11-05 DIAGNOSIS — O283 Abnormal ultrasonic finding on antenatal screening of mother: Secondary | ICD-10-CM | POA: Insufficient documentation

## 2021-11-05 DIAGNOSIS — Z3402 Encounter for supervision of normal first pregnancy, second trimester: Secondary | ICD-10-CM | POA: Insufficient documentation

## 2021-11-05 DIAGNOSIS — O358XX0 Maternal care for other (suspected) fetal abnormality and damage, not applicable or unspecified: Secondary | ICD-10-CM | POA: Insufficient documentation

## 2021-11-05 DIAGNOSIS — O99013 Anemia complicating pregnancy, third trimester: Secondary | ICD-10-CM | POA: Diagnosis not present

## 2021-11-05 DIAGNOSIS — O26843 Uterine size-date discrepancy, third trimester: Secondary | ICD-10-CM | POA: Diagnosis present

## 2021-11-05 NOTE — Progress Notes (Signed)
Result note. [place] for size-date discrepancy, [place] normal.  ?Fayette Pho, MD ? ?

## 2021-11-11 ENCOUNTER — Ambulatory Visit (INDEPENDENT_AMBULATORY_CARE_PROVIDER_SITE_OTHER): Payer: Medicaid Other | Admitting: Family Medicine

## 2021-11-11 ENCOUNTER — Other Ambulatory Visit: Payer: Self-pay

## 2021-11-11 DIAGNOSIS — Z3493 Encounter for supervision of normal pregnancy, unspecified, third trimester: Secondary | ICD-10-CM

## 2021-11-11 NOTE — Patient Instructions (Signed)
It was wonderful to see you today. Thank you for allowing me to be a part of your care. Below is a short summary of what we discussed at your visit today: ? ?Pregnancy ?Everything is looking good! Baby's heart rate was normal. And baby is head down still! Come back in about 2 weeks for your next visit at 36 weeks of pregnancy. After that, your visits will be weekly.  ? ?Continue to take your iron supplementation for the anemia.  ? ?Continue to take your aspirin for pre-eclampsia prevention.  ? ?Prenatal Classes ?Go to [email or web address] for more information on the pregnancy and child birth classes that [place] has to offer.  ? ?Emergency Planning ?If you experience any vaginal bleeding, leakage of fluids, don't feel your baby moving as much, or start to have contractions less than 5 minutes apart please go directly to the Maternal Assessment Unit at [facility] for evaluation. ? ?Maternity and women's care services located on the [place] side of The [place]. [facility] (Entrance C off [address]  ?[address] Entrance [address] ?[place],  [place]  [address] ? ?  ? ?Maternal Mental Health ?If you start to develop the below symptoms of depression, please reach out to [place] for an appointment. There is also a [place] at [phone]). This hotline has trained counselors, doulas, and midwifes to real-time support, information, and resources.  ?Feeling sad or hopeless most of the time ?Lack of interest in things you used to enjoy ?Less interest in caring for yourself (dressing, fixing hair) ?Trouble concentrating ?Trouble coping with daily tasks ?Constant worry about your baby ?Sleeping or eating too much or too little ?Feeling very anxious or nervous ?Unexplained irritability or anger ?Unwanted or scary thoughts ?Feeling that you are not a good mother ?Thoughts of hurting yourself or your baby ? ?If you feel  you are experiencing a mental health crisis, please reach out to the [facility] at 1-800-273-TALK (307)491-0272) or go directly to the [facility], open 24/7.  ?[phone] ?[address], [place],  [address]  ? ? ?Please bring all of your medications to every appointment! ? ?If you have any questions or concerns, please do not hesitate to contact us via phone or MyChart message.  ? ?Ezequiel Essex, MD  ?

## 2021-11-11 NOTE — Progress Notes (Signed)
?  [facility] Prenatal Visit ? ?Tammie Edwards is a 21 y.o. G1P0 at [redacted]w[redacted]d here for routine follow up. She is dated by LMP.  She reports no complaints.  She reports fetal movement. She denies vaginal bleeding, contractions, or loss of fluid.  See flow sheet for details. ? ?Vitals:  ? 11/11/21 0858  ?BP: 99/69  ?Pulse: 75  ? ?A/P: Pregnancy at [redacted]w[redacted]d.  Doing well.   ?Routine prenatal care:  ?Dating reviewed, dating tab is correct ?Fetal heart tones: Appropriate - 135-137 bpm ?Fundal height: within expected range.  - 31-32 cm (at last visit was noted to measure smaller than dates, subsequent [place] normal) ?The patient does not have a history of HSV and valacyclovir is not indicated at this time.  ?The patient does not have a history of Cesarean delivery and no referral to Center for [facility] is indicated ?Infant feeding choice: Both  ?Contraception choice: Undecided  ?Infant circumcision desired not applicable ?Influenza vaccine not administered as patient is allergic ?Tdap was not given today - given at last appointment ?COVID vaccination was discussed and declined. Patient has received initial series and booster previously.  ?Childbirth and education classes were offered - see AVS ?Preterm labor and fetal movement precautions reviewed. ? ? ?2. Pregnancy issues include the following and were addressed as appropriate today: ? Continue asa 81 mg  ?Prenatal anemia - continue iron supplementation ?Declined COVID today, continue to offer ?Previous 1 hour GTT normal ?Previous HIV and RPR negative ? Problem list and pregnancy box updated: Yes.  ? ?Scheduled for Ob Faculty clinic in third trimester on 11/26/2021.  ? ?Follow up 2 weeks.  ? ?Fayette Pho, MD ? ?

## 2021-11-25 ENCOUNTER — Encounter: Payer: Medicaid Other | Admitting: Family Medicine

## 2021-11-26 ENCOUNTER — Other Ambulatory Visit: Payer: Self-pay

## 2021-11-26 ENCOUNTER — Other Ambulatory Visit (HOSPITAL_COMMUNITY)
Admission: RE | Admit: 2021-11-26 | Discharge: 2021-11-26 | Disposition: A | Discharge status: Home / Self Care | Payer: Medicaid Other | Source: Ambulatory Visit | Attending: Family Medicine | Admitting: Family Medicine

## 2021-11-26 ENCOUNTER — Ambulatory Visit (INDEPENDENT_AMBULATORY_CARE_PROVIDER_SITE_OTHER): Payer: Medicaid Other | Admitting: Family Medicine

## 2021-11-26 VITALS — BP 106/72 | HR 91 | Wt 128.0 lb

## 2021-11-26 DIAGNOSIS — O99013 Anemia complicating pregnancy, third trimester: Secondary | ICD-10-CM | POA: Diagnosis not present

## 2021-11-26 DIAGNOSIS — Z3493 Encounter for supervision of normal pregnancy, unspecified, third trimester: Secondary | ICD-10-CM | POA: Insufficient documentation

## 2021-11-26 LAB — OB RESULTS CONSOLE GC/CHLAMYDIA: Gonorrhea: NEGATIVE

## 2021-11-26 MED ORDER — ALBUTEROL SULFATE HFA 108 (90 BASE) MCG/ACT IN AERS: INHALATION_SPRAY | RESPIRATORY_TRACT | 1 refills | Status: AC

## 2021-11-26 NOTE — Patient Instructions (Addendum)
It was nice to meet you today! ? ?Refilled albuterol ?Check out https://www.bedsider.org/ for information on birth control ?Testing for infections today ?Also checking blood counts ? ?Go to the MAU at [facility] if: ?You begin to have strong, frequent contractions ?Your water breaks.  Sometimes it is a big gush of fluid, sometimes it is just a trickle that keeps getting your underwear wet or running down your legs ?You have vaginal bleeding.  It is normal to have a small amount of spotting if your cervix was checked.  ?You do not feel your baby moving like normal.  If you do not, get something to eat and drink and lay down and focus on feeling your baby move.   If your baby is still not moving like normal, you should go to MAU.  ? ?Be well, ?Dr. Pollie Meyer  ?

## 2021-11-26 NOTE — Progress Notes (Signed)
[facility] ?Faculty OB Clinic Visit ? ?Tammie Edwards is a 21 y.o. G1P0 at [redacted]w[redacted]d (via LMP=10w sono) who presents to [facility] OB Clinic for routine follow up. Prenatal course, history, notes, ultrasounds, and laboratory results reviewed. ? ?Denies cramping/ctx, fluid leaking, vaginal bleeding, or decreased fetal movement. Taking PNV.   ? ?Primary Prenatal Care Provider: Drs. Cresenzo/Lynn ? ?Postpartum Plans: ?- delivery planning: SVD ?- circumcision: n/a ?- feeding: breast & formula ?- pediatrician: undecided; will likely go to doctor in [place] since she lives there ?- contraception: declines ? ?BP 106/72 ?FHR: 145bpm ?Uterine size: 32.5cm ? ?Assessment & Plan ? ?1. Routine prenatal care: ?- gc/chlamydia/trich collected today ?- fetal presentation confirmed vertex via leopolds and informal bedside u/s ?- provided bedsider.org website for info on birth control methods, discussed importance of birth spacing ?- encouraged COVID booster, declines at this time ?- previously listed as varicella immunity unknown but I was able to confirm prior vaccination in [place] ?- encouraged childbirth and breastfeeding classes ? ?2. GBS bacteriuria - 1000 colonies earlier in pregnancy ?- needs intrapartum antibiotics, no PCN allergy ? ?3. Anemia - never started iron, was 9.5 at last check ?- recheck CBC today to monitor, along w/ ferritin ? ?4. Size-date discrepancy - persists but FH is appropriately rising, prior growth u/s normal, normal AFI ?- continue to monitor ? ?5. Asthma - not needing albuterol at present but does not have inhaler ?- refill inhaler ? ?Next prenatal visit in 1 week. Labor & fetal movement precautions discussed. ? ?Levert Feinstein, MD ?[facility]  ?

## 2021-11-27 ENCOUNTER — Encounter: Payer: Self-pay | Admitting: Family Medicine

## 2021-11-27 LAB — CERVICOVAGINAL ANCILLARY ONLY
Chlamydia: NEGATIVE
Comment: NEGATIVE
Comment: NEGATIVE
Comment: NORMAL
Neisseria Gonorrhea: NEGATIVE
Trichomonas: POSITIVE — AB

## 2021-11-30 ENCOUNTER — Encounter: Payer: Self-pay | Admitting: Family Medicine

## 2021-11-30 DIAGNOSIS — A5901 Trichomonal vulvovaginitis: Secondary | ICD-10-CM | POA: Insufficient documentation

## 2021-11-30 LAB — FERRITIN

## 2021-11-30 LAB — CBC
Hematocrit: 28 % — ABNORMAL LOW (ref 34.0–46.6)
Hemoglobin: 9.5 g/dL — ABNORMAL LOW (ref 11.1–15.9)
MCH: 28.8 pg (ref 26.6–33.0)
MCHC: 33.9 g/dL (ref 31.5–35.7)
MCV: 85 fL (ref 79–97)
Platelets: 253 10*3/uL (ref 150–450)
RBC: 3.3 x10E6/uL — ABNORMAL LOW (ref 3.77–5.28)
RDW: 12.6 % (ref 11.7–15.4)
WBC: 6.9 10*3/uL (ref 3.4–10.8)

## 2021-11-30 MED ORDER — METRONIDAZOLE 500 MG PO TABS: 500.0000 mg | ORAL_TABLET | Freq: Two times a day (BID) | ORAL | 0 refills | Status: DC

## 2021-11-30 MED ORDER — FERROUS SULFATE 324 (65 FE) MG PO TBEC: 1.0000 | DELAYED_RELEASE_TABLET | ORAL | 1 refills | Status: DC

## 2021-11-30 NOTE — Telephone Encounter (Signed)
Contacted patient via phone to discuss results. ?Advised positive trichomonas, explained it is an STI and partner needs to be treated as well. ?Should abstain from intercourse for 7 days after both she and her partner are treated in order to prevent reinfection. ? ?Also advised starting iron every other day since hgb remains low at 9.5 (patient never started iron previously). ? ?Will send in rx for flagyl and iron. ? ?Patient appreciative, all questions answered. Has follow up in place with Dr. Miquel Dunn for OB visit in 2 days. ? ?Latrelle Dodrill, MD  ?

## 2021-12-02 ENCOUNTER — Ambulatory Visit (INDEPENDENT_AMBULATORY_CARE_PROVIDER_SITE_OTHER): Payer: Medicaid Other | Admitting: Family Medicine

## 2021-12-02 VITALS — BP 110/65 | HR 95 | Wt 128.5 lb

## 2021-12-02 DIAGNOSIS — Z3402 Encounter for supervision of normal first pregnancy, second trimester: Secondary | ICD-10-CM

## 2021-12-02 NOTE — Progress Notes (Signed)
[facility] ?Faculty OB Clinic Visit ? ?Tammie Edwards is a 21 y.o. G1P0 at [redacted]w[redacted]d (via LMP c/w 10 wk sono) who presents for routine follow up. Prenatal course, history, notes, ultrasounds, and laboratory results reviewed. ? ?Denies cramping/ctx, fluid leaking, vaginal bleeding, or decreased fetal movement. Taking PNV.   ? ?Primary Prenatal Care Provider: Dr Sharlette Dense ? ?Postpartum Plans: ?- delivery planning: SVD ?- circumcision: n/a ?- feeding: breast and formula ?- pediatrician: undecided, [facility] ?- contraception: declines ? ?Vitals:  ? 12/02/21 0934  ?BP: 110/65  ?Pulse: 95  ? ? ? ?FHR: 141 ?Uterine size: 33.5 cm ? ?Assessment & Plan ? ?1. Routine prenatal care: ?- discussed labor precautions, carseat, cervical exams ? ?2. Trichomonas infection- on 11/26/21, given Flagyl 500mg  BID, has started and has a few days left. Abstaining from intercourse, partner has also been treated with the 2g one time yesterday. ? ?2. GBS bacteriuria - 1000 colonies earlier in pregnancy ?- needs intrapartum antibiotics, no PCN allergy ?  ?3. Anemia - Hgb 9.5 11/26/21, has started PO iron yesterday. Recheck in 1 month. ?  ?4. Size-date discrepancy - persists but FH is appropriately rising, prior growth u/s normal, normal AFI- continue to monitor ?  ?5. Asthma - not needing albuterol at present but does not have inhaler, refilled last visit ? ?Next prenatal visit in 1 weeks . Labor & fetal movement precautions discussed. ? ?01/26/22, MD ?[facility]  ? ?

## 2021-12-02 NOTE — Patient Instructions (Signed)
Please make a follow up appointment in 1 weeks.  Pregnancy Related Return Precautions The follow are signs/symptoms that are abnormal in pregnancy and may require further evaluation by a physician: Go to the MAU at [facility] [place] if: You have cramping/contractions that do not go away with drinking water, especially if they are lasting 30 seconds to 1.5 minutes, coming and going every 5-10 minutes for an hour or more, or are getting stronger and you cannot walk or talk while having a contraction/cramp. Your water breaks.  Sometimes it is a big gush of fluid, sometimes it is just a trickle that keeps getting your underwear wet or running down your legs You have vaginal bleeding.    You do not feel your baby moving like normal.  If you do not, get something to eat and drink (something cold or something with sugar like peanut butter or juice) and lay down and focus on feeling your baby move. If your baby is still not moving like normal, you should go to MAU. You should feel your baby move 6 times in one hour, or 10 times in two hours. You have a persistent headache that does not go away with 1 g of Tylenol, vision changes, chest pain, difficulty breathing, severe pain in your right upper abdomen, worsening leg swelling- these can all be signs of high blood pressure in pregnancy and need to be evaluated by a provider immediately  These are all concerning in pregnancy and if you have any of these I recommend you call your PCP and present to the Maternity Admissions Unit (map below) for further evaluation.  For any pregnancy-related emergencies, please go to the Maternity Admissions Unit in the [facility] at [facility]. You will use hospital Entrance C.    Our clinic number is [phone].   Dr Appollonia Klee  

## 2021-12-09 ENCOUNTER — Ambulatory Visit (INDEPENDENT_AMBULATORY_CARE_PROVIDER_SITE_OTHER): Payer: Medicaid Other | Admitting: Family Medicine

## 2021-12-09 DIAGNOSIS — Z3493 Encounter for supervision of normal pregnancy, unspecified, third trimester: Secondary | ICD-10-CM

## 2021-12-09 NOTE — Patient Instructions (Signed)
It was wonderful to see you today. Thank you for allowing me to be a part of your care. Below is a short summary of what we discussed at your visit today: ? ?If you experience any vaginal bleeding, leakage of fluids, don't feel your baby moving as much, or start to have contractions less than 5 minutes apart please go directly to the Maternal Assessment Unit at [facility] for evaluation. ? ?Maternity and women's care services located on the [place] side of [facility] [place]. [facility] (Entrance C off [address]  ?[address] Entr[address] ?[place],  [place]  [address] ? ?  ? ?Maternal Mental Health ?If you start to develop the below symptoms of depression, please reach out to [place] for an appointment. There is also a [place] at [phone]). This hotline has trained counselors, doulas, and midwifes to real-time support, information, and resources.  ?Feeling sad or hopeless most of the time ?Lack of interest in things you used to enjoy ?Less interest in caring for yourself (dressing, fixing hair) ?Trouble concentrating ?Trouble coping with daily tasks ?Constant worry about your baby ?Sleeping or eating too much or too little ?Feeling very anxious or nervous ?Unexplained irritability or anger ?Unwanted or scary thoughts ?Feeling that you are not a good mother ?Thoughts of hurting yourself or your baby ? ?If you feel you are experiencing a mental health crisis, please reach out to the [place] at 1-800-273-TALK [phone]) or go directly to the [facility], open 24/7.  ?[phone] ?[address], [place],  [address]  ? ?Please bring all of your medications to every appointment! ? ?If you have any questions or concerns, please do not hesitate to contact us via phone or MyChart message.  ? ?Ezequiel Essex, MD  ?

## 2021-12-09 NOTE — Progress Notes (Signed)
?  [place] Prenatal Visit ? ?Tammie Edwards is a 21 y.o. G1P0 at [redacted]w[redacted]d here for routine follow up. She is dated by LMP.  She reports no bleeding, no contractions, no cramping, no leaking, and foot swelling . She reports fetal movement. She denies vaginal bleeding, contractions, or loss of fluid. See flow sheet for details. ? ?Vitals:  ? 12/09/21 0901  ?BP: 97/71  ?Pulse: 68  ? ?A/P: Pregnancy at [redacted]w[redacted]d.  Doing well.   ?Routine prenatal care:  ?Dating reviewed, dating tab is correct ?Fetal heart tones Appropriate - 150s ?Fundal height >2 cm from expected size given dating, discussed with preceptor.  ?Fetal position confirmed Vertex using Ultrasound .  ?Infant feeding choice: Both  ?Contraception choice: Undecided  ?Infant circumcision desired not applicable ?Pain control in labor discussed and patient desires epidural.  ?Influenza vaccine not administered as patient declined, will continue to discuss.   ?Tdap previously administered between 27-36 weeks  on 10/30/2021 ?GBS and gc/chlamydia testing results were reviewed today.  GBS+ in early pregnancy, will need intrapartum abx. GC/CH negative.  ?Pregnancy education regarding labor, fetal movement,  benefits of breastfeeding, contraception, and safe infant sleep were discussed.  ?Labor and fetal movement precautions reviewed. ?Induction of labor discussed.  ?Scheduled for 39 week and 40 week appointment.  ? ?2. Pregnancy issues include the following and were addressed as appropriate today: ? ?- Anemia: Taking iron supplementation every other day. Repeat Hgb around 12/30/21 ? ?- Trichomonas (+): Will need TOC at next prenatal visit. She just finished abx Mon 4/17, so today's appointment is too soon for TOC.  ? ?- GBS+ in early pregnancy, will need intrapartum abx. GC/CH negative. No PCN allergy. ? ?- Size-date discrepancy - persists but FH is appropriately rising, prior growth u/s normal, normal AFI- continue to monitor ? ?- Asthma - not needing  albuterol at present  ? ?- At next prenatal, will need scheduled for 40.0-40.6 NSTs and 41 week induction ? ?Problem list and pregnancy box updated: Yes.  ? ?  ?Follow up 1 week.  Scheduled for 4/27 with Dr. Caron Presume and 5/03 with [facility] (given Drs. Jeani Hawking and Cresenzo had no availability on that date).  ? ?Ezequiel Essex, MD ? ?

## 2021-12-14 ENCOUNTER — Encounter: Payer: Self-pay | Admitting: Family Medicine

## 2021-12-17 ENCOUNTER — Other Ambulatory Visit (HOSPITAL_COMMUNITY)
Admission: RE | Admit: 2021-12-17 | Discharge: 2021-12-17 | Disposition: A | Discharge status: Home / Self Care | Payer: Medicaid Other | Source: Ambulatory Visit | Attending: Family Medicine | Admitting: Family Medicine

## 2021-12-17 ENCOUNTER — Ambulatory Visit (INDEPENDENT_AMBULATORY_CARE_PROVIDER_SITE_OTHER): Payer: Medicaid Other | Admitting: Family Medicine

## 2021-12-17 ENCOUNTER — Other Ambulatory Visit: Payer: Self-pay

## 2021-12-17 VITALS — BP 106/66 | HR 68 | Wt 130.6 lb

## 2021-12-17 DIAGNOSIS — A5901 Trichomonal vulvovaginitis: Secondary | ICD-10-CM

## 2021-12-17 DIAGNOSIS — O23593 Infection of other part of genital tract in pregnancy, third trimester: Secondary | ICD-10-CM | POA: Diagnosis present

## 2021-12-17 DIAGNOSIS — O99013 Anemia complicating pregnancy, third trimester: Secondary | ICD-10-CM

## 2021-12-17 DIAGNOSIS — Z3493 Encounter for supervision of normal pregnancy, unspecified, third trimester: Secondary | ICD-10-CM

## 2021-12-17 NOTE — Progress Notes (Signed)
?  [place] Prenatal Visit ? ?Tammie Edwards is a 21 y.o. G1P0 at [redacted]w[redacted]d here for routine follow up. She is dated by LMP.  She reports no bleeding, no contractions, no cramping, no leaking, and vaginal irritation. She reports fetal movement. She denies vaginal bleeding, contractions, or loss of fluid. See flow sheet for details. ? ?There were no vitals filed for this visit. ? ?A/P: Pregnancy at [redacted]w[redacted]d.  Doing well.   ?Routine prenatal care:  ?Dating reviewed, dating tab is correct ?Fetal heart tones Appropriate157 ?Fundal height >2 cm from expected size given dating, discussed with preceptor.  36 cm ?Fetal position confirmed Vertex using Ultrasound .  ?Infant feeding choice: Both  ?Contraception choice: Undecided  ?Infant circumcision desired not applicable ?Pain control in labor discussed and patient desires epidural.  ?Influenza vaccine not administered as not influenza season.   ?Tdap previously administered between 27-36 weeks  ?GBS and gc/chlamydia testing results were reviewed today.  Patient was GBS positive previously in pregnancy.  She was positive for trichomonas on 4/6 and has taken her Flagyl.  Test of cure performed today. ?Pregnancy education regarding labor, fetal movement,  benefits of breastfeeding, contraception, and safe infant sleep were discussed.  ?Labor and fetal movement precautions reviewed. ?Induction of labor discussed. Scheduled for induction at approximately 41 weeks. BPP scheduled between 40-41 weeks.  ? ?2. Pregnancy issues include the following and were addressed as appropriate today: ? ? Anemia in the third trimester: Patient reports that she is taking iron supplementation every other day.  I have placed a future order for repeat hemoglobin to be collected at her next visit ?Trichomonas positive: Test of cure collected today.  Will monitor and treat as needed ?GBS positive early in pregnancy, patient will need intrapartum antibiotics no penicillin  allergy ?GC/chlamydia negative ?Size/date discrepancy-continues to persist fetal heart rate is appropriate today, has had prior growth ultrasound which was normal with normal AFI.  We will continue to monitor ?Asthma-no treatment needed at this time ?Scheduled for BPP between 40 and 41 weeks ?Scheduled for 41-week induction ? Problem list and pregnancy box updated: Yes.  ? ?Follow up 1 week.  ? ?

## 2021-12-17 NOTE — Patient Instructions (Signed)
It was great seeing you today!  We did a test of cure today to make sure you have cleared your infection.  I will call you with those results if there is anything abnormal or send you a MyChart message.  I am also checking for bacterial vaginosis and yeast to see if we need to treat that.  If you have any vaginal bleeding, gush of fluid, decreased fetal movement, or regular contractions please be assessed at the maternal assessment unit.  We have scheduled your induction for 5/10 and someone will call you to come in at that time.  We also scheduled you to have 1 more ultrasound which will be done on 5/8 at 8:15 in the morning.  Please be sure to go to that.  If you have any questions or concerns call the clinic.  Hope you have a wonderful day! ? ? ?

## 2021-12-18 ENCOUNTER — Encounter (HOSPITAL_COMMUNITY): Payer: Self-pay | Admitting: Obstetrics and Gynecology

## 2021-12-18 ENCOUNTER — Other Ambulatory Visit: Payer: Self-pay | Admitting: Family Medicine

## 2021-12-18 ENCOUNTER — Inpatient Hospital Stay (HOSPITAL_COMMUNITY)
Admission: AD | Admit: 2021-12-18 | Discharge: 2021-12-18 | Disposition: A | Discharge status: Home / Self Care | Payer: Medicaid Other | Source: Home / Self Care | Attending: Obstetrics and Gynecology | Admitting: Obstetrics and Gynecology

## 2021-12-18 ENCOUNTER — Inpatient Hospital Stay (HOSPITAL_COMMUNITY): Payer: Medicaid Other | Admitting: Anesthesiology

## 2021-12-18 ENCOUNTER — Inpatient Hospital Stay (HOSPITAL_COMMUNITY)
Admission: AD | Admit: 2021-12-18 | Discharge: 2021-12-20 | DRG: 807 | Disposition: A | Discharge status: Home / Self Care | Payer: Medicaid Other | Attending: Obstetrics & Gynecology | Admitting: Obstetrics & Gynecology

## 2021-12-18 DIAGNOSIS — D649 Anemia, unspecified: Secondary | ICD-10-CM | POA: Diagnosis not present

## 2021-12-18 DIAGNOSIS — O471 False labor at or after 37 completed weeks of gestation: Secondary | ICD-10-CM | POA: Insufficient documentation

## 2021-12-18 DIAGNOSIS — Z7982 Long term (current) use of aspirin: Secondary | ICD-10-CM

## 2021-12-18 DIAGNOSIS — O99013 Anemia complicating pregnancy, third trimester: Secondary | ICD-10-CM | POA: Diagnosis present

## 2021-12-18 DIAGNOSIS — O99820 Streptococcus B carrier state complicating pregnancy: Secondary | ICD-10-CM | POA: Diagnosis not present

## 2021-12-18 DIAGNOSIS — J45909 Unspecified asthma, uncomplicated: Secondary | ICD-10-CM | POA: Diagnosis present

## 2021-12-18 DIAGNOSIS — O9902 Anemia complicating childbirth: Secondary | ICD-10-CM | POA: Diagnosis present

## 2021-12-18 DIAGNOSIS — O9832 Other infections with a predominantly sexual mode of transmission complicating childbirth: Secondary | ICD-10-CM | POA: Diagnosis not present

## 2021-12-18 DIAGNOSIS — Z3493 Encounter for supervision of normal pregnancy, unspecified, third trimester: Secondary | ICD-10-CM

## 2021-12-18 DIAGNOSIS — Z3A39 39 weeks gestation of pregnancy: Secondary | ICD-10-CM | POA: Insufficient documentation

## 2021-12-18 DIAGNOSIS — R8271 Bacteriuria: Secondary | ICD-10-CM | POA: Diagnosis present

## 2021-12-18 DIAGNOSIS — O9952 Diseases of the respiratory system complicating childbirth: Secondary | ICD-10-CM | POA: Diagnosis not present

## 2021-12-18 DIAGNOSIS — Z3689 Encounter for other specified antenatal screening: Secondary | ICD-10-CM | POA: Insufficient documentation

## 2021-12-18 DIAGNOSIS — O283 Abnormal ultrasonic finding on antenatal screening of mother: Secondary | ICD-10-CM | POA: Diagnosis present

## 2021-12-18 DIAGNOSIS — Z3490 Encounter for supervision of normal pregnancy, unspecified, unspecified trimester: Principal | ICD-10-CM | POA: Insufficient documentation

## 2021-12-18 DIAGNOSIS — A5901 Trichomonal vulvovaginitis: Secondary | ICD-10-CM | POA: Diagnosis present

## 2021-12-18 HISTORY — DX: Anemia, unspecified: D64.9

## 2021-12-18 LAB — CBC
HCT: 32 % — ABNORMAL LOW (ref 36.0–46.0)
Hemoglobin: 10 g/dL — ABNORMAL LOW (ref 12.0–15.0)
MCH: 26.8 pg (ref 26.0–34.0)
MCHC: 31.3 g/dL (ref 30.0–36.0)
MCV: 85.8 fL (ref 80.0–100.0)
Platelets: 268 10*3/uL (ref 150–400)
RBC: 3.73 MIL/uL — ABNORMAL LOW (ref 3.87–5.11)
RDW: 13.7 % (ref 11.5–15.5)
WBC: 8.8 10*3/uL (ref 4.0–10.5)
nRBC: 0 % (ref 0.0–0.2)

## 2021-12-18 LAB — CERVICOVAGINAL ANCILLARY ONLY
Bacterial Vaginitis (gardnerella): NEGATIVE
Candida Glabrata: NEGATIVE
Candida Vaginitis: POSITIVE — AB
Comment: NEGATIVE
Comment: NEGATIVE
Comment: NEGATIVE
Comment: NEGATIVE
Trichomonas: NEGATIVE

## 2021-12-18 LAB — TYPE AND SCREEN
ABO/RH(D): O POS
Antibody Screen: NEGATIVE

## 2021-12-18 LAB — OB RESULTS CONSOLE GBS: GBS: POSITIVE

## 2021-12-18 MED ORDER — PHENYLEPHRINE 80 MCG/ML (10ML) SYRINGE FOR IV PUSH (FOR BLOOD PRESSURE SUPPORT): 80.0000 ug | PREFILLED_SYRINGE | INTRAVENOUS | Status: DC | PRN

## 2021-12-18 MED ORDER — PENICILLIN G POT IN DEXTROSE 60000 UNIT/ML IV SOLN
3.0000 10*6.[IU] | INTRAVENOUS | Status: DC
  Administered 2021-12-18: 3 10*6.[IU] via INTRAVENOUS
  Filled 2021-12-18: qty 50

## 2021-12-18 MED ORDER — MORPHINE SULFATE (PF) 4 MG/ML IV SOLN
2.0000 mg | Freq: Once | INTRAVENOUS | Status: AC
Start: 2021-12-18 — End: 2021-12-18
  Administered 2021-12-18: 2 mg via INTRAVENOUS
  Filled 2021-12-18: qty 1

## 2021-12-18 MED ORDER — OXYCODONE-ACETAMINOPHEN 5-325 MG PO TABS: 2.0000 | ORAL_TABLET | ORAL | Status: DC | PRN

## 2021-12-18 MED ORDER — OXYTOCIN-SODIUM CHLORIDE 30-0.9 UT/500ML-% IV SOLN
2.5000 [IU]/h | INTRAVENOUS | Status: DC
  Administered 2021-12-18: 2.5 [IU]/h via INTRAVENOUS
  Filled 2021-12-18: qty 500

## 2021-12-18 MED ORDER — EPHEDRINE 5 MG/ML INJ: 10.0000 mg | INTRAVENOUS | Status: DC | PRN

## 2021-12-18 MED ORDER — LACTATED RINGERS IV SOLN: 500.0000 mL | INTRAVENOUS | Status: DC | PRN

## 2021-12-18 MED ORDER — ONDANSETRON HCL 4 MG/2ML IJ SOLN
4.0000 mg | Freq: Once | INTRAMUSCULAR | Status: DC
  Filled 2021-12-18: qty 2

## 2021-12-18 MED ORDER — FENTANYL-BUPIVACAINE-NACL 0.5-0.125-0.9 MG/250ML-% EP SOLN
12.0000 mL/h | EPIDURAL | Status: DC | PRN
  Administered 2021-12-18: 12 mL/h via EPIDURAL
  Filled 2021-12-18: qty 250

## 2021-12-18 MED ORDER — ONDANSETRON HCL 4 MG/2ML IJ SOLN: 4.0000 mg | Freq: Four times a day (QID) | INTRAMUSCULAR | Status: DC | PRN

## 2021-12-18 MED ORDER — SODIUM CHLORIDE 0.9 % IV SOLN
5.0000 10*6.[IU] | Freq: Once | INTRAVENOUS | Status: AC
  Administered 2021-12-18: 5 10*6.[IU] via INTRAVENOUS
  Filled 2021-12-18: qty 5

## 2021-12-18 MED ORDER — OXYTOCIN BOLUS FROM INFUSION
333.0000 mL | Freq: Once | INTRAVENOUS | Status: AC
  Administered 2021-12-18: 333 mL via INTRAVENOUS

## 2021-12-18 MED ORDER — LACTATED RINGERS IV SOLN: INTRAVENOUS | Status: DC

## 2021-12-18 MED ORDER — TERBUTALINE SULFATE 1 MG/ML IJ SOLN
0.2500 mg | Freq: Once | INTRAMUSCULAR | Status: AC
  Administered 2021-12-18: 0.25 mg via SUBCUTANEOUS

## 2021-12-18 MED ORDER — FENTANYL-BUPIVACAINE-NACL 0.5-0.125-0.9 MG/250ML-% EP SOLN: 12.0000 mL/h | EPIDURAL | Status: DC | PRN

## 2021-12-18 MED ORDER — DIPHENHYDRAMINE HCL 50 MG/ML IJ SOLN
12.5000 mg | INTRAMUSCULAR | Status: DC | PRN
  Administered 2021-12-18 (×2): 12.5 mg via INTRAVENOUS
  Filled 2021-12-18: qty 1

## 2021-12-18 MED ORDER — LIDOCAINE HCL (PF) 1 % IJ SOLN: 30.0000 mL | INTRAMUSCULAR | Status: DC | PRN

## 2021-12-18 MED ORDER — OXYCODONE-ACETAMINOPHEN 5-325 MG PO TABS: 1.0000 | ORAL_TABLET | ORAL | Status: DC | PRN

## 2021-12-18 MED ORDER — SOD CITRATE-CITRIC ACID 500-334 MG/5ML PO SOLN: 30.0000 mL | ORAL | Status: DC | PRN

## 2021-12-18 MED ORDER — OXYCODONE-ACETAMINOPHEN 5-325 MG PO TABS
1.0000 | ORAL_TABLET | Freq: Once | ORAL | Status: AC
  Administered 2021-12-18: 1 via ORAL
  Filled 2021-12-18: qty 1

## 2021-12-18 MED ORDER — TERBUTALINE SULFATE 1 MG/ML IJ SOLN
INTRAMUSCULAR | Status: AC
  Filled 2021-12-18: qty 1

## 2021-12-18 MED ORDER — LACTATED RINGERS IV SOLN: 500.0000 mL | Freq: Once | INTRAVENOUS | Status: DC

## 2021-12-18 MED ORDER — LACTATED RINGERS IV BOLUS
1000.0000 mL | Freq: Once | INTRAVENOUS | Status: AC
  Administered 2021-12-18: 1000 mL via INTRAVENOUS

## 2021-12-18 MED ORDER — LIDOCAINE HCL (PF) 1 % IJ SOLN
INTRAMUSCULAR | Status: DC | PRN
  Administered 2021-12-18: 8 mL via EPIDURAL

## 2021-12-18 MED ORDER — DIPHENHYDRAMINE HCL 50 MG/ML IJ SOLN: 12.5000 mg | INTRAMUSCULAR | Status: DC | PRN

## 2021-12-18 MED ORDER — MORPHINE SULFATE (PF) 4 MG/ML IV SOLN: 2.0000 mg | Freq: Once | INTRAVENOUS | Status: DC

## 2021-12-18 MED ORDER — ACETAMINOPHEN 325 MG PO TABS: 650.0000 mg | ORAL_TABLET | ORAL | Status: DC | PRN

## 2021-12-18 NOTE — MAU Note (Signed)
Pt says UC  strong since  ?[facility] with [facility] on [address] ?[address] - tried  x2 - hurts  ?GBS- positive  ?Denies HSV ? ?

## 2021-12-18 NOTE — Progress Notes (Signed)
Labor Progress Note ? ?Tammie Edwards is a 21 y.o. G1P0 at [redacted]w[redacted]d presented for SOL.  ? ?Patient doing well. Went to recheck cervix as she was due to check, upon arrival to room, FHR was in the 90's. Checked cervix, about 9cm and still intact, previous 4cm 4 hours ago. After verbal consent, performed AROM with small amount of clear fluid. Shortly after that felt to be about 9.5cm with soft stretchable cervix. Continued to have HR variation around 90-110 bpm despite changing positions/IV fluid bolus. Placed FSE and then placed onto her left side with resolution of prolonged deceleration.  ? ?FHT now reassuring with baseline 125/moderate variability. Suspect quick cervical change as likely etiology. Hopeful for SVD shortly.  ? ?Allayne Stack, DO  ?

## 2021-12-18 NOTE — MAU Provider Note (Addendum)
MAU Labor Check ? ?S: Ms. Tammie Edwards is a 21 y.o. G1P0 at [redacted]w[redacted]d  who presents to MAU today complaining contractions q 8-10 minutes since midnight . She denies vaginal bleeding. She denies LOF. She reports normal fetal movement.   ? ?O: BP 107/69   Pulse 68   Temp 98 ?F (36.7 ?C) (Oral)   Resp 20   Ht 5' (1.524 m)   Wt 59.5 kg   LMP 03/18/2021   SpO2 98%   BMI 25.60 kg/m?  ? ? ?Cervical exam:  ?Dilation: 1 ?Effacement (%): 70 ?Cervical Position: Posterior ?Station: -3 ?Presentation: Vertex ?Exam by:: Santiago Bur, RN ?Checked 3 times: Initial check changed from 0-1 over 1 hour then was unchanged at hour 3 ? ?Fetal Monitoring: ?Baseline: 130 ?Variability: moderate ?Accelerations: 15x15 ?Decelerations: none ?Contractions: Every 8-10 minutes  ? ? ?A: ?SIUP at [redacted]w[redacted]d  ?False labor ? ?P: ?False Labor ?-- MAU observation ?-- labor precautions given  ?-- reactive NST  ?-- percocet 5-325mg  given once for pain with symptom improvement ? ?Derenda Fennel, DO, PGY-1 ?12/18/2021 6:05 AM  ? ?GME ATTESTATION:  ?I agree with the findings and the plan of care as documented in the resident?s note. ? ?Dr. Tacy Learn initially discussed with Wynelle Bourgeois, CNM for patient's pain management. Both Marie and I reviewed the strip and reactive/reassuring. MAU precautions given.  ? ?Leticia Penna, DO ?OB Fellow, Faculty Practice ?[place], [facility] ?12/18/2021 7:16 AM  ?

## 2021-12-18 NOTE — Addendum Note (Signed)
Addended by: Celedonio Savage on: 12/18/2021 01:54 PM ? ? Modules accepted: Orders ? ?

## 2021-12-18 NOTE — Lactation Note (Signed)
This note was copied from a baby's chart. ?Lactation Consultation Note ? ?Patient Name: Tammie Edwards ?Today's Date: 12/18/2021 ?Reason for consult: L&D Initial assessment ?Age:20 hours ?P1, term female infant. ?Mom's feeding choice is breast and formula feeding. ?Mom was doing skin to skin with infant when [facility] entered the room. ?LC ask mom to do breast stimulation, prior to latching infant to help evert nipple shaft out more. ?Mom latched infant on her left breast using the cross cradle hold, infant was on and off breast for 12 minutes. ?Mom could benefit from hand pump to pre- pump breast on MBU and breast shells to wear in bra when not sleeping, see nipple type in Latch assessment. ?Mom knows to ask RN/LC for further latch assistance on MBU if needed. ?LC congratulated parents on the birth of their daughter.  ?Maternal Data ?Does the patient have breastfeeding experience prior to this delivery?: No ? ?Feeding ?Mother's Current Feeding Choice: Breast Milk and Formula ? ?LATCH Score ?Latch: Repeated attempts needed to sustain latch, nipple held in mouth throughout feeding, stimulation needed to elicit sucking reflex. ? ?Audible Swallowing: None ? ?Type of Nipple: Flat ? ?Comfort (Breast/Nipple): Soft / non-tender ? ?Hold (Positioning): Assistance needed to correctly position infant at breast and maintain latch. ? ?LATCH Score: 5 ? ? ?Lactation Tools Discussed/Used ?  ? ?Interventions ?Interventions: Assisted with latch;Breast compression;Adjust position;Support pillows;Position options;Education ? ?Discharge ?  ? ?Consult Status ?Consult Status: Follow-up from L&D ? ? ? ?Danelle Earthly ?12/18/2021, 11:36 PM ? ? ? ?

## 2021-12-18 NOTE — Discharge Summary (Signed)
? ?  Postpartum Discharge Summary ?   ?Patient Name: Tammie Edwards ?DOB: [birth year] ?MRN: [id number] ? ?Date of admission: 12/18/2021 ?Delivery date:12/18/2021  ?Delivering provider: Patriciaann Clan  ?Date of discharge: 12/20/2021 ? ?Admitting diagnosis: Encounter for induction of labor [Z34.90] ?Intrauterine pregnancy: [redacted]w[redacted]d    ?Secondary diagnosis:  Active Problems: ?  Echogenic intracardiac focus of fetus on prenatal ultrasound ?  GBS bacteriuria ?  Asthma ?  Anemia of pregnancy in third trimester ?  Trichomonal vaginitis during pregnancy ? ?Additional problems: n/a    ?Discharge diagnosis: Term Pregnancy Delivered                                              ?Post partum procedures: n/a ?Augmentation: AROM ?Complications: None ? ?Hospital course: Onset of Labor With Vaginal Delivery      ?21y.o. yo G1P0 at 378w2das admitted in Latent Labor on 12/18/2021. Patient had an uncomplicated labor course as follows:  ?Membrane Rupture Time/Date: 9:42 PM ,12/18/2021   ?Delivery Method:Vaginal, Spontaneous  ?Episiotomy: None  ?Lacerations:  Labial  ?Patient had an uncomplicated postpartum course.  She is ambulating, tolerating a regular diet, passing flatus, and urinating well. Patient is discharged home in stable condition on 12/20/21. ? ?Newborn Data: ?Birth date:12/18/2021  ?Birth time:10:39 PM  ?Gender:Female  ?Living status:Living  ?Apgars:9 ,9  ?Weight:2700 g  ? ?Magnesium Sulfate received: No ?BMZ received: No ?Rhophylac:No ?MMR:No ?T-DaP:Given prenatally ?Flu: No ?Transfusion:No ? ?Physical exam  ?Vitals:  ? 12/19/21 0844 12/19/21 1328 12/19/21 2210 12/20/21 0549  ?BP: 111/81 102/66 111/76 94/61  ?Pulse: (!) 59 71 84 (!) 56  ?Resp: '18 16 17 18  ' ?Temp: 98.1 ?F (36.7 ?C) 98.4 ?F (36.9 ?C) 98.1 ?F (36.7 ?C) 98.3 ?F (36.8 ?C)  ?TempSrc: Oral Oral Oral Oral  ?SpO2: 100% 98% 100%   ? ?General: alert, cooperative, and no distress ?Lochia: appropriate ?Uterine Fundus: firm ?Incision: N/A ?DVT Evaluation: No evidence of DVT  seen on physical exam. ?Labs: ?Lab Results  ?Component Value Date  ? WBC 8.8 12/18/2021  ? HGB 10.0 (L) 12/18/2021  ? HCT 32.0 (L) 12/18/2021  ? MCV 85.8 12/18/2021  ? PLT 268 12/18/2021  ? ?   ? View : No data to display.  ?  ?  ?  ? ?Edinburgh Score: ?   ? View : No data to display.  ?  ?  ?  ? ? ? ?After visit meds:  ?Allergies as of 12/20/2021   ? ?   Reactions  ? Other Swelling  ? Flu vaccine injection  ? ?  ? ?  ?Medication List  ?  ? ?STOP taking these medications   ? ?aspirin EC 81 MG tablet ?  ? ?  ? ?TAKE these medications   ? ?albuterol 108 (90 Base) MCG/ACT inhaler ?Commonly known as: VENTOLIN HFA ?Take one or two puffs 20 minutes before exercise and may repeat after exercise or q 8 prn ?  ?ferrous sulfate 324 (65 Fe) MG Tbec ?Take 1 tablet (325 mg total) by mouth every other day. ?  ?ibuprofen 600 MG tablet ?Commonly known as: ADVIL ?Take 1 tablet (600 mg total) by mouth every 6 (six) hours. ?  ?PRENATAL VITAMIN PO ?Take by mouth. ?  ? ?  ? ? ? ?Discharge home in stable condition ?Infant Feeding: Bottle ?Infant Disposition:home with mother ?Discharge  instruction: per After Visit Summary and Postpartum booklet. ?Activity: Advance as tolerated. Pelvic rest for 6 weeks.  ?Diet: routine diet ?Future Appointments: ?Future Appointments  ?Date Time Provider [place]  ?12/23/2021  9:10 AM ACCESS TO CARE POOL [facility]  ?12/28/2021  8:15 AM [facility] NST [facility]  ? ?Follow up Visit: ? Follow-up Information   ? ? [place] Follow up.   ?Why: In 4 weeks for a postpartum appt ?Contact information: ?[address] ?[place] ?[phone] ? ?  ?  ? ?  ?  ? ?  ? ? ?Message sent to [facility] by Dr Higinio Plan:  ?Please schedule this patient for a In person postpartum visit in 6 weeks with the following provider: Any provider. ?Additional Postpartum F/U: None   ?Low risk pregnancy complicated by:  +trich with negative TOC ?Delivery mode:  Vaginal, Spontaneous   ?Anticipated Birth Control:  Unsure ? ? ?12/20/2021 ?Wende Mott, CNM ? ? ? ?

## 2021-12-18 NOTE — Anesthesia Preprocedure Evaluation (Signed)
Anesthesia Evaluation  ?Patient identified by MRN, date of birth, ID band ?Patient awake ? ? ? ?Reviewed: ?Allergy & Precautions, H&P , NPO status , Patient's Chart, lab work & pertinent test results, reviewed documented beta blocker date and time  ? ?Airway ?Mallampati: II ? ?TM Distance: >3 FB ?Neck ROM: full ? ? ? Dental ?no notable dental hx. ?(+) Teeth Intact, Dental Advisory Given ?  ?Pulmonary ?neg pulmonary ROS,  ?  ?Pulmonary exam normal ?breath sounds clear to auscultation ? ? ? ? ? ? Cardiovascular ?negative cardio ROS ?Normal cardiovascular exam ?Rhythm:regular Rate:Normal ? ? ?  ?Neuro/Psych ?negative neurological ROS ? negative psych ROS  ? GI/Hepatic ?negative GI ROS, Neg liver ROS,   ?Endo/Other  ?negative endocrine ROS ? Renal/GU ?negative Renal ROS  ?negative genitourinary ?  ?Musculoskeletal ? ? Abdominal ?  ?Peds ? Hematology ? ?(+) Blood dyscrasia, anemia ,   ?Anesthesia Other Findings ? ? Reproductive/Obstetrics ?(+) Pregnancy ? ?  ? ? ? ? ? ? ? ? ? ? ? ? ? ?  ?  ? ? ? ? ? ? ? ? ?Anesthesia Physical ?Anesthesia Plan ? ?ASA: 2 ? ?Anesthesia Plan: Epidural  ? ?Post-op Pain Management: Minimal or no pain anticipated  ? ?Induction:  ? ?PONV Risk Score and Plan: 2 ? ?Airway Management Planned: Natural Airway ? ?Additional Equipment: None ? ?Intra-op Plan:  ? ?Post-operative Plan:  ? ?Informed Consent: I have reviewed the patients History and Physical, chart, labs and discussed the procedure including the risks, benefits and alternatives for the proposed anesthesia with the patient or authorized representative who has indicated his/her understanding and acceptance.  ? ? ? ? ? ?Plan Discussed with: Anesthesiologist ? ?Anesthesia Plan Comments:   ? ? ? ? ? ? ?Anesthesia Quick Evaluation ? ?

## 2021-12-18 NOTE — Progress Notes (Signed)
LABOR PROGRESS NOTE ? ?Tammie Edwards is a 21 y.o. G1P0 at [redacted]w[redacted]d  admitted for SOL with contractions/early labor and nrFHT (prolonged decel in the MAU). ? ?Subjective: ?Patient currently doing well and resting comfortably in no acute distress.  Reports that she feels much better with this epidural in place.  Cannot feel the contractions. ? ?Objective: ?BP 104/60   Pulse 88   Temp 97.8 ?F (36.6 ?C) (Oral)   Resp 16   LMP 03/18/2021   SpO2 100%  or  ?Vitals:  ? 12/18/21 1701 12/18/21 1731 12/18/21 1801 12/18/21 1831  ?BP: 107/68 (!) 86/61 104/69 104/60  ?Pulse: 92  74 88  ?Resp: 16 16 18 16   ?Temp:   97.8 ?F (36.6 ?C)   ?TempSrc:   Oral   ?SpO2:      ? ? ? ?Dilation: 4 ?Effacement (%): 90 ?Station: -2 ?Presentation: Vertex ?Exam by:: [place], rn ?FHT: baseline rate 135, moderate varibility, + acel, occasional variable decel ?Toco: Every 4-5 minutes ? ?Labs: ?Lab Results  ?Component Value Date  ? WBC 8.8 12/18/2021  ? HGB 10.0 (L) 12/18/2021  ? HCT 32.0 (L) 12/18/2021  ? MCV 85.8 12/18/2021  ? PLT 268 12/18/2021  ? ? ?Patient Active Problem List  ? Diagnosis Date Noted  ? Encounter for induction of labor 12/18/2021  ? Trichomonal vaginitis during pregnancy 11/30/2021  ? Anemia of pregnancy in third trimester 11/02/2021  ? Uterine size-date discrepancy in third trimester 10/30/2021  ? GBS bacteriuria 09/10/2021  ? Asymptomatic bacteriuria during pregnancy in second trimester 09/10/2021  ? Asthma 09/10/2021  ? Echogenic intracardiac focus of fetus on prenatal ultrasound 08/28/2021  ? Third trimester pregnancy 06/10/2021  ? ? ?Assessment / Plan: ?22 y.o. G1P0 at [redacted]w[redacted]d here for SOL and nrFHT (prolonged decel in MAU) ? ?Labor: Progressing spontaneously without augmentation at this time.  Plan for continued monitoring and augmentation as needed, consider rupture of membranes at next check if continues to progress. ?Fetal Wellbeing: Category 1, reassuring ?ID: GBS bacteriuria, received first dose of penicillin at 1530,  positive trichomonas with negative test of cure on 4/27 ?Pain Control: Epidural in place ?Anticipated MOD: Vaginal delivery ? ?5/27, MD  ?PGY-3, [facility]  ?12/18/2021, 6:37 PM ? ?

## 2021-12-18 NOTE — MAU Note (Signed)
.  Tammie Edwards is a 21 y.o. at [redacted]w[redacted]d here in MAU reporting: ctx that have been going on since last night but more intense since around 1145. Denies VB or LOF. +FM. Was seen in mau last night for evaluation and she was 1cm.  ? ?Pain score: 8 ? ?FHT:133 ? ?

## 2021-12-18 NOTE — Anesthesia Procedure Notes (Signed)
Epidural ?Patient location during procedure: OB ?Start time: 12/18/2021 4:13 PM ?End time: 12/18/2021 4:17 PM ? ?Staffing ?Anesthesiologist: Bethena Midget, MD ? ?Preanesthetic Checklist ?Completed: patient identified, IV checked, site marked, risks and benefits discussed, surgical consent, monitors and equipment checked, pre-op evaluation and timeout performed ? ?Epidural ?Patient position: sitting ?Prep: DuraPrep and site prepped and draped ?Patient monitoring: continuous pulse ox and blood pressure ?Approach: midline ?Location: L3-L4 ?Injection technique: LOR air ? ?Needle:  ?Needle type: Tuohy  ?Needle gauge: 17 G ?Needle length: 9 cm and 9 ?Needle insertion depth: 4 cm ?Catheter type: closed end flexible ?Catheter size: 19 Gauge ?Catheter at skin depth: 9 cm ?Test dose: negative ? ?Assessment ?Events: blood not aspirated, injection not painful, no injection resistance, no paresthesia and negative IV test ? ? ? ?

## 2021-12-18 NOTE — H&P (Signed)
OBSTETRIC ADMISSION HISTORY AND PHYSICAL ? ?Tammie Edwards is a 21 y.o. female G1P0 with IUP at [redacted]w[redacted]d by LMP confirmed by Early [place] presenting for contractions/early labor and nrFHT (prolonged decel in MAU). She reports +FMs, No LOF, no VB, no blurry vision, headaches or peripheral edema, and RUQ pain.  She plans on breast and formula feeding. She declines birth control. ?She received her prenatal care at [facility]  ? ?Dating: By LMP/early [place] --->  Estimated Date of Delivery: 12/23/21 ? ?Sono:   ?@[redacted]w[redacted]d , CWD, normal anatomy, cephalic presentation, posterior plancetal lie, 2059g, 32% EFW ? ? ?Prenatal History/Complications:  ?Anemia of pregnancy - on every other day Iron ?Trichomonas in pregnancy on 4/6, treated 4/10, negative TOC on 4/27 ?Past Medical History: ?Past Medical History:  ?Diagnosis Date  ? Asthma   ? ? ?Past Surgical History: ?Past Surgical History:  ?Procedure Laterality Date  ? TYMPANOSTOMY TUBE PLACEMENT    ? ? ?Obstetrical History: ?OB History   ? ? Gravida  ?1  ? Para  ?   ? Term  ?   ? Preterm  ?   ? AB  ?   ? Living  ?   ?  ? ? SAB  ?   ? IAB  ?   ? Ectopic  ?   ? Multiple  ?   ? Live Births  ?0  ?   ?  ?  ? ? ?Social History ?Social History  ? ?Socioeconomic History  ? Marital status: Single  ?  Spouse name: Not on file  ? Number of children: Not on file  ? Years of education: Not on file  ? Highest education level: Not on file  ?Occupational History  ? Not on file  ?Tobacco Use  ? Smoking status: Never  ? Smokeless tobacco: Not on file  ?Vaping Use  ? Vaping Use: Former  ? Substances: CBD  ?Substance and Sexual Activity  ? Alcohol use: Not Currently  ? Drug use: Yes  ?  Types: Marijuana  ? Sexual activity: Not on file  ?Other Topics Concern  ? Not on file  ?Social History Narrative  ? Not on file  ? ?Social Determinants of Health  ? ?Financial Resource Strain: Not on file  ?Food Insecurity: Not on file  ?Transportation Needs: Not on file  ?Physical Activity: Not on file  ?Stress: Not on file  ?Social  Connections: Not on file  ? ? ?Family History: ?Family History  ?Problem Relation Age of Onset  ? Diabetes Neg Hx   ? Hypertension Neg Hx   ? ? ?Allergies: ?Allergies  ?Allergen Reactions  ? Other Swelling  ?  Flu vaccine injection  ? ? ?Medications Prior to Admission  ?Medication Sig Dispense Refill Last Dose  ? ferrous sulfate 324 (65 Fe) MG TBEC Take 1 tablet (325 mg total) by mouth every other day. 30 tablet 1 12/17/2021  ? Prenatal Vit-Fe Fumarate-FA (PRENATAL VITAMIN PO) Take by mouth.   12/17/2021  ? albuterol (VENTOLIN HFA) 108 (90 Base) MCG/ACT inhaler Take one or two puffs 20 minutes before exercise and may repeat after exercise or q 8 prn 1 each 1   ? aspirin EC 81 MG tablet Take 1 tablet (81 mg total) by mouth daily. Swallow whole. 30 tablet 11   ? ? ? ?Review of Systems  ? ?All systems reviewed and negative except as stated in HPI ? ?Blood pressure 117/83, pulse 70, temperature 97.8 ?F (36.6 ?C), temperature source Oral, resp. rate 16,  last menstrual period 03/18/2021, SpO2 100 %. ?General appearance: alert, on MAU exam on hands and knees and contracting very painfully  ?Lungs: clear to auscultation bilaterally ?Heart: regular rate and rhythm ?Abdomen: soft, non-tender; bowel sounds normal ?Extremities: Homans sign is negative, no sign of DVT ?Presentation: cephalic on MAU exam ?Fetal monitoring Baseline: 130 bpm, Variability: Good {> 6 bpm), Accelerations: Reactive. ?Had a 5-6 min prolonged decel in MAU (discontinuous tracing but appeared to slowly come back to baseline over min 7-12). Baseline ~120 with moderate variability  after decel.  ? ?Once on L& D baseline now 135 with continued moderate variability, accels present, no decels ?Uterine activity every 5-6 prior to decel, then appeared to have irritability on monitor prior to and at beginning of decel (possible one tetanic long contraction lasting 2-3 min). Once on L& D every 1-3 min ?Dilation: 2.5 ?Effacement (%): 80 ?Station: -1 ?Exam by:: Imagene Sheller RN ? ? ?Prenatal labs: ?ABO, Rh: O/Positive/-- (09/21 1102) ?Antibody: Negative (09/21 1102) ?Rubella: 5.72 (09/21 1102) ?RPR: Non Reactive (03/10 9622)  ?HBsAg: Negative (09/21 1102)  ?HIV: Non Reactive (03/10 2979)  ?GBS:   bateriuira (diagnosed on 10/19) ?1 hr Glucola negative ?Genetic screening  declined ?Anatomy US EIF, otherwise normal ? ?Prenatal Transfer Tool  ?Maternal Diabetes: No ?Genetic Screening: Normal ?Maternal Ultrasounds/Referrals: Isolated EIF (echogenic intracardiac focus) ?Fetal Ultrasounds or other Referrals:  None ?Maternal Substance Abuse:  No ?Significant Maternal Medications:  Meds include: Other: Iron ?Significant Maternal Lab Results: Group B Strep positive(urine) ? ?No results found for this or any previous visit (from the past 24 hour(s)). ? ?Patient Active Problem List  ? Diagnosis Date Noted  ? Encounter for induction of labor 12/18/2021  ? Trichomonal vaginitis during pregnancy 11/30/2021  ? Anemia of pregnancy in third trimester 11/02/2021  ? Uterine size-date discrepancy in third trimester 10/30/2021  ? GBS bacteriuria 09/10/2021  ? Asymptomatic bacteriuria during pregnancy in second trimester 09/10/2021  ? Asthma 09/10/2021  ? Echogenic intracardiac focus of fetus on prenatal ultrasound 08/28/2021  ? Third trimester pregnancy 06/10/2021  ? ? ?Assessment/Plan:  ?Tammie Edwards is a 21 y.o. G1P0 at [redacted]w[redacted]d here for early labor and nrFHT (prolonged decel in MAU) ? ?#Labor: presented to MAU for labor check. Was 2 cm and unchanged but painfully contracting and therefore was kept in MAU for observation/recheck and pain control. While being observed she had a 5-6 min prolonged decel in MAU (discontinuous tracing but appeared to slowly come back to baseline over min 7-12). Baseline ~120 with moderate variability  after decel. However given prolonged decel and at 39 weeks will admit to L&D. ? ?Once on L& D baseline now 135 with continued moderate variability, accels present, no  decels.  ? ?Continues to contract spontaneously. Will expectantly manage at this time. ? ?Discussed with patient and partner that after deceleration, fetal heart rate now reassuring with accels. Discussed that we suspect there was hyperstimulation due to back to back contractions/long contraction that led to fetal distress. Improved after terbutaline and now contracting every 2-3 min with cat I tracing. Discussed that at this time there is no indication for emergent cesarean delivery. However if there are further signs of fetal distress and no recovery while still remote from delivery a cesarean delivery would be discussed. Patient and partner express understanding.  ?#Pain: Plans epidural, Would like her epidural once labs back for pain control as well as in case there are further signs of fetal distress that requiring cesarean delivery ?#FWB: Cat  II for decel, and now cat I with accels ?#ID:  GBS in urine>PCN ?#MOF: breast ?#MOC: none/declines ?#Circ:  N/A ? ? ?#Trichomonas in pregnancy ?Treated 4/10, TOC neg 4/27 ? ?#Anemia of pregnancy ?HgB 9.5>10.0. On PO Iron. Continue PO iron after delivery. Watch for blood loss at delivery and can give TXA if warranted/repeat CBC after  ? ?Warner MccreedyAnuka Ethie Curless, MD, MPH ?OB Fellow, Faculty Practice ?

## 2021-12-19 ENCOUNTER — Encounter (HOSPITAL_COMMUNITY): Payer: Self-pay | Admitting: Family Medicine

## 2021-12-19 LAB — RPR: RPR Ser Ql: NONREACTIVE

## 2021-12-19 MED ORDER — DIPHENHYDRAMINE HCL 25 MG PO CAPS: 25.0000 mg | ORAL_CAPSULE | Freq: Four times a day (QID) | ORAL | Status: DC | PRN

## 2021-12-19 MED ORDER — ONDANSETRON HCL 4 MG PO TABS: 4.0000 mg | ORAL_TABLET | ORAL | Status: DC | PRN

## 2021-12-19 MED ORDER — ZOLPIDEM TARTRATE 5 MG PO TABS: 5.0000 mg | ORAL_TABLET | Freq: Every evening | ORAL | Status: DC | PRN

## 2021-12-19 MED ORDER — WITCH HAZEL-GLYCERIN EX PADS: 1.0000 "application " | MEDICATED_PAD | CUTANEOUS | Status: DC | PRN

## 2021-12-19 MED ORDER — COCONUT OIL OIL: 1.0000 "application " | TOPICAL_OIL | Status: DC | PRN

## 2021-12-19 MED ORDER — DIBUCAINE (PERIANAL) 1 % EX OINT
1.0000 | TOPICAL_OINTMENT | CUTANEOUS | Status: DC | PRN
Start: 2021-12-19 — End: 2021-12-20

## 2021-12-19 MED ORDER — PRENATAL MULTIVITAMIN CH
1.0000 | ORAL_TABLET | Freq: Every day | ORAL | Status: DC
  Administered 2021-12-19: 1 via ORAL
  Filled 2021-12-19: qty 1

## 2021-12-19 MED ORDER — IBUPROFEN 600 MG PO TABS
600.0000 mg | ORAL_TABLET | Freq: Four times a day (QID) | ORAL | Status: DC
  Administered 2021-12-19 – 2021-12-20 (×4): 600 mg via ORAL
  Filled 2021-12-19 (×4): qty 1

## 2021-12-19 MED ORDER — SENNOSIDES-DOCUSATE SODIUM 8.6-50 MG PO TABS
2.0000 | ORAL_TABLET | Freq: Every day | ORAL | Status: DC
  Administered 2021-12-19: 2 via ORAL
  Filled 2021-12-19: qty 2

## 2021-12-19 MED ORDER — ACETAMINOPHEN 325 MG PO TABS
650.0000 mg | ORAL_TABLET | ORAL | Status: DC | PRN
  Administered 2021-12-19: 650 mg via ORAL
  Filled 2021-12-19: qty 2

## 2021-12-19 MED ORDER — ONDANSETRON HCL 4 MG/2ML IJ SOLN: 4.0000 mg | INTRAMUSCULAR | Status: DC | PRN

## 2021-12-19 MED ORDER — BENZOCAINE-MENTHOL 20-0.5 % EX AERO
1.0000 "application " | INHALATION_SPRAY | CUTANEOUS | Status: DC | PRN
  Administered 2021-12-19: 1 via TOPICAL
  Filled 2021-12-19: qty 56

## 2021-12-19 MED ORDER — SIMETHICONE 80 MG PO CHEW: 80.0000 mg | CHEWABLE_TABLET | ORAL | Status: DC | PRN

## 2021-12-19 MED ORDER — TETANUS-DIPHTH-ACELL PERTUSSIS 5-2.5-18.5 LF-MCG/0.5 IM SUSY: 0.5000 mL | PREFILLED_SYRINGE | Freq: Once | INTRAMUSCULAR | Status: DC

## 2021-12-19 NOTE — Plan of Care (Signed)
?  Problem: Education: ?Goal: Knowledge of General Education information will improve ?Description: Including pain rating scale, medication(s)/side effects and non-pharmacologic comfort measures ?Outcome: Completed/Met ?  ?

## 2021-12-19 NOTE — Lactation Note (Signed)
This note was copied from a baby's chart. ?Lactation Consultation Note ? ?Patient Name: Tammie Edwards ?Today's Date: 12/19/2021 ?Reason for consult: Initial assessment;Term;Primapara;1st time breastfeeding;Infant < 6lbs ?Age:20 hours ? ? ?P1 mother whose infant is now 51 hours old.  This is a term baby at 39+2 weeks.  Mother's current feeding preference is breast/formula. Baby is DAT+. ? ?Baby "[name]" was swaddled and asleep in the bassinet when I arrived.  Reviewed breast feeding basics.  Mother demonstrated hand expression; no drops noted.  Attempted to latch "[name]", however, she was too sleepy to latch.  Placed her STS on mother's chest and she fell asleep.  Mother will supplement with formula.   ? ?Encouraged to feed at least every three hours due to weight < 6 lbs. And +DAT.  RN in room to obtain Tch; a serum level will follow.  Provided breast shells and a manual pump for nipple eversion.  Mother aware of sizing and cleaning; will call as needed for latch assistance.  RN updated. ? ? ?Maternal Data ?Has patient been taught Hand Expression?: Yes ?Does the patient have breastfeeding experience prior to this delivery?: No ? ?Feeding ?Mother's Current Feeding Choice: Breast Milk and Formula ?Nipple Type: Nfant Standard Flow (white) ? ?LATCH Score ?Latch: Too sleepy or reluctant, no latch achieved, no sucking elicited. ? ?Audible Swallowing: None ? ?Type of Nipple: Everted at rest and after stimulation (short shafted; inverts with compression) ? ?Comfort (Breast/Nipple): Soft / non-tender ? ?Hold (Positioning): Assistance needed to correctly position infant at breast and maintain latch. ? ?LATCH Score: 5 ? ? ?Lactation Tools Discussed/Used ?Tools: Shells;Pump;Flanges ?Flange Size: 24 ?Breast pump type: Manual ?Pump Education: Setup, frequency, and cleaning;Milk Storage ?Reason for Pumping: Nipple eversion ?Pumping frequency: Prior to latching and prn ? ?Interventions ?Interventions: Breast feeding basics  reviewed;Assisted with latch;Skin to skin;Breast massage;Hand express;Pre-pump if needed;Shells;Position options;Support pillows;Adjust position;Education;LC Services brochure ? ?Discharge ?  ? ?Consult Status ?Consult Status: Follow-up ?Date: 12/20/21 ?Follow-up type: In-patient ? ? ? ?Laurana Magistro R Jr Milliron ?12/19/2021, 10:00 AM ? ? ? ?

## 2021-12-19 NOTE — Plan of Care (Signed)

## 2021-12-19 NOTE — Progress Notes (Addendum)
POSTPARTUM PROGRESS NOTE ? ?Subjective: Tammie Edwards is a 21 y.o. G1P1001 s/p SVD at [redacted]w[redacted]d.  She reports she doing well. No acute events overnight and reports sleeping most of the night since delivery. She denies any problem voiding or po intake. Denies nausea or vomiting. She has passed flatus. Pain is moderately controlled.  Lochia is medium. ? ?Objective: ?Blood pressure 102/72, pulse 77, temperature 98.3 ?F (36.8 ?C), temperature source Oral, resp. rate 18, last menstrual period 03/18/2021, SpO2 95 %, unknown if currently breastfeeding. ? ?Physical Exam:  ?General: alert, cooperative and no distress ?Chest: no respiratory distress ?Abdomen: soft, non-tender  ?Uterine Fundus: firm and at level of umbilicus ?Extremities: No calf swelling or tenderness  No edema ? ?Recent Labs  ?  12/18/21 ?1540  ?HGB 10.0*  ?HCT 32.0*  ? ? ?Assessment/Plan: ?Tammie Edwards is a 21 y.o. G1P1001 s/p SVD at [redacted]w[redacted]d for SOL. ? ?Routine Postpartum Care: Doing well, pain well-controlled.  ?-- Continue routine care, lactation support, encouraged ambulation ?-- Contraception: undecided  ?-- Feeding: Breast feeding ? ?Dispo: Plan for discharge tomorrow. ? ?Jerre Simon, MD ?Faculty Practice, [facility] ?12/19/2021 7:59 AM  ? ?Midwife attestation ?Post Partum Day 1 ?I have seen and examined this patient and agree with above documentation in the resident's note.  ? ?Tammie Edwards is a 21 y.o. G1P1001 s/p vaginal delivery.  Pt denies problems with ambulating, voiding or po intake. Pain is well controlled.  Plan for birth control is  undecided .  Method of Feeding: breast ? ?PE:  ?BP 102/72   Pulse 77   Temp 98.3 ?F (36.8 ?C) (Oral)   Resp 18   LMP 03/18/2021   SpO2 95%   Breastfeeding Unknown  ?Gen: well appearing ?Heart: reg rate ?Lungs: normal WOB ?Fundus firm ?Ext: soft, no pain, no edema ? ?Plan for discharge: 12/20/21 ? ?Sharen Counter, CNM ?9:11 AM  ?

## 2021-12-19 NOTE — Anesthesia Postprocedure Evaluation (Signed)
Anesthesia Post Note ? ?Patient: Tammie Edwards ? ?Procedure(s) Performed: AN AD HOC LABOR EPIDURAL ? ?  ? ?Patient location during evaluation: Mother Baby ?Anesthesia Type: Epidural ?Level of consciousness: awake and alert and oriented ?Pain management: satisfactory to patient ?Vital Signs Assessment: post-procedure vital signs reviewed and stable ?Respiratory status: respiratory function stable ?Cardiovascular status: stable ?Postop Assessment: no headache, no backache, epidural receding, patient able to bend at knees, no signs of nausea or vomiting, adequate PO intake and able to ambulate ?Anesthetic complications: no ? ? ?No notable events documented. ? ?Last Vitals:  ?Vitals:  ? 12/19/21 0111 12/19/21 0440  ?BP: 122/77 102/72  ?Pulse: 71 77  ?Resp: 18 18  ?Temp: 36.9 ?C 36.8 ?C  ?SpO2:    ?  ?Last Pain:  ?Vitals:  ? 12/19/21 0844  ?TempSrc:   ?PainSc: 2   ? ?Pain Goal:   ? ?  ?  ?  ?  ?  ?  ?  ? ?Reisha Wos ? ? ? ? ?

## 2021-12-19 NOTE — Plan of Care (Signed)
?  Problem: Education: Goal: Knowledge of Childbirth will improve Outcome: Completed/Met Goal: Ability to make informed decisions regarding treatment and plan of care will improve Outcome: Completed/Met Goal: Ability to state and carry out methods to decrease the pain will improve Outcome: Completed/Met Goal: Individualized Educational Video(s) Outcome: Completed/Met   Problem: Coping: Goal: Ability to verbalize concerns and feelings about labor and delivery will improve Outcome: Completed/Met   Problem: Life Cycle: Goal: Ability to make normal progression through stages of labor will improve Outcome: Completed/Met Goal: Ability to effectively push during vaginal delivery will improve Outcome: Completed/Met   Problem: Role Relationship: Goal: Will demonstrate positive interactions with the child Outcome: Completed/Met   Problem: Safety: Goal: Risk of complications during labor and delivery will decrease Outcome: Completed/Met   Problem: Pain Management: Goal: Relief or control of pain from uterine contractions will improve Outcome: Completed/Met   

## 2021-12-19 NOTE — Lactation Note (Signed)
This note was copied from a baby's chart. ?Lactation Consultation Note ? ?Patient Name: Tammie Edwards ?Today's Date: 12/19/2021 ?Reason for consult: Follow-up assessment;Mother's request;Difficult latch;1st time breastfeeding;Term;Infant < 6lbs (DAT+, mom has alot of areola edema) ?Age:20 hours, less than 6 lbs, -1% weight loss ?LC unable to assist with latch, infant recently given 10 mls of 22 kcal formula using a white Nfant slow flow nipple. ?Per mom, infant only latch briefly 2 times today, she made attempts but infant has mostly formula feed. ?Mom will call LC for assistance with next latch. ?Mom was set up with DEBP, mom was pumping as LC left the room. ?LC reinforced to wear breast shells due mom having a lot of areola edema. ?Mom's current plan: ?1- Mom will attempt latch infant at next feeding and call LC for latch assistance. ?2- Afterwards mom will supplement infant with any EBM/Formula :Day 1 ( 5-7 mls per feeding), if not latched mom will continue to offer 10 mls per feeding. ?3- Mom will continue to use DEBP every 3 hours for 15 minutes on initial setting and offer infant any EBM first before formula ?Maternal Data ?  ? ?Feeding ?Mother's Current Feeding Choice: Breast Milk and Formula ?Nipple Type: Nfant Standard Flow (white) ? ?LATCH Score ?  ? ?  ? ?  ? ?  ? ?  ? ?  ? ? ?Lactation Tools Discussed/Used ?Tools: Shells ?Flange Size: 27 (27 mm best fit due to alot of areola edema) ?Breast pump type: Double-Electric Breast Pump ?Pump Education: Setup, frequency, and cleaning;Milk Storage ?Reason for Pumping: Infant not latching at breast, DAT+, less than 6 lbs at birth. ?Pumping frequency: Mom will pump every 3 hours for 15 minutes on inital setting. ? ?Interventions ?  ? ?Discharge ?  ? ?Consult Status ?Consult Status: Follow-up ?Date: 12/20/21 ?Follow-up type: In-patient ? ? ? ?Danelle Earthly ?12/19/2021, 7:31 PM ? ? ? ?

## 2021-12-19 NOTE — Plan of Care (Signed)
?  Problem: Education: Goal: Knowledge of General Education information will improve Description: Including pain rating scale, medication(s)/side effects and non-pharmacologic comfort measures Outcome: Completed/Met   Problem: Education: Goal: Knowledge of condition will improve Outcome: Completed/Met   

## 2021-12-19 NOTE — Lactation Note (Signed)
This note was copied from a baby's chart. ?Lactation Consultation Note ? ?Patient Name: Tammie Edwards ?Today's Date: 12/19/2021 ?Reason for consult: Follow-up assessment;Mother's request;Difficult latch ?Age:20 hours, less than 6 lbs, -1% weight loss, DAT+ ?Mom been wearing breast shells due to areola edema, LC observed mom's nipples are more everted and compressible since using breast shells. ?Mom attempted latch infant at breast, however, infant did not sustained latch, mom was fitted with 24 mm NS that was pre-filled 0.5 mls of formula. ?Infant sustained latch and BF for 20 minutes with 24 mm NS on mom's left breast using the football hold position.See flow sheet for Latch assessment. ?Afterwards dad supplemented infant with formula using white Nfant nipple while mom was using the DEBP. ?Per mom, when she pumped earlier she did see few drops of colostrum. ?Mom's current plan: ?1- Mom will attempt to latch infant according to hunger cues, on demand, 8 to 12+ or more times within 24 hours, skin to skin. ?2- Mom will pre-pump breast and apply 24 mm NS, prior to latching infant at the breast. ?3- Afterwards dad plans to continue to supplement infant with formula while mom uses the DEBP. ?4- Mom will continue to use breast shells when not sleeping to help alleviate areola edema. ?Maternal Data ?  ? ?Feeding ?Mother's Current Feeding Choice: Breast Milk and Formula ?Nipple Type: Nfant Standard Flow (white) ? ?LATCH Score ?  ? ?  ? ?  ? ?  ? ?  ? ?  ? ? ?Lactation Tools Discussed/Used ?Tools: Nipple Dorris Carnes ?Nipple shield size: 24 (Infant not been  latching at the breast, mostly been formula feeding, mom has flat nipples, started to use breast shells.) ?Flange Size: 27 (27 mm best fit due to alot of areola edema) ?Breast pump type: Double-Electric Breast Pump ?Pump Education: Setup, frequency, and cleaning;Milk Storage ?Reason for Pumping: Infant not latching at breast, DAT+, less than 6 lbs at birth. ?Pumping  frequency: Mom will pump every 3 hours for 15 minutes on inital setting. ? ?Interventions ?  ? ?Discharge ?  ? ?Consult Status ?Consult Status: Follow-up ?Date: 12/20/21 ?Follow-up type: In-patient ? ? ? ?Danelle Earthly ?12/19/2021, 10:51 PM ? ? ? ?

## 2021-12-20 MED ORDER — IBUPROFEN 600 MG PO TABS
600.0000 mg | ORAL_TABLET | Freq: Four times a day (QID) | ORAL | 0 refills | Status: DC
Start: 2021-12-20 — End: 2024-01-02

## 2021-12-20 NOTE — Plan of Care (Signed)
?  Problem: Clinical Measurements: Goal: Ability to maintain clinical measurements within normal limits will improve Outcome: Completed/Met Goal: Will remain free from infection Outcome: Completed/Met Goal: Diagnostic test results will improve Outcome: Completed/Met Goal: Respiratory complications will improve Outcome: Completed/Met Goal: Cardiovascular complication will be avoided Outcome: Completed/Met   Problem: Activity: Goal: Risk for activity intolerance will decrease Outcome: Completed/Met   Problem: Nutrition: Goal: Adequate nutrition will be maintained Outcome: Completed/Met   Problem: Coping: Goal: Level of anxiety will decrease Outcome: Completed/Met   

## 2021-12-22 ENCOUNTER — Telehealth: Payer: Self-pay

## 2021-12-22 NOTE — Telephone Encounter (Signed)
Transition Care Management Follow-up Telephone Call ?Date of discharge and from where: 12/20/2021 from [facility] ?How have you been since you were released from the hospital? Patient stated that she is feeling well and did not have any questions or concerns at this time.  ?Any questions or concerns? No ? ?Items Reviewed: ?Did the pt receive and understand the discharge instructions provided? No  ?Medications obtained and verified? Yes  ?Other? No  ?Any new allergies since your discharge? No  ?Dietary orders reviewed? No ?Do you have support at home? Yes  ? ?Functional Questionnaire: (I = Independent and D = Dependent) ?ADLs: I ? ?Bathing/Dressing- I ? ?Meal Prep- I ? ?Eating- I ? ?Maintaining continence- I ? ?Transferring/Ambulation- I ? ?Managing Meds- I ? ? ?Follow up appointments reviewed: ? ?PCP Hospital f/u appt confirmed? No   ?Specialist Hospital f/u appt confirmed? Yes  Scheduled to see [facility] on 12/24/2021. ?Are transportation arrangements needed? No  ?If their condition worsens, is the pt aware to call PCP or go to the Emergency Dept.? Yes ?Was the patient provided with contact information for the PCP's office or ED? Yes ?Was to pt encouraged to call back with questions or concerns? Yes ? ?

## 2021-12-23 ENCOUNTER — Encounter: Payer: Medicaid Other | Admitting: Family Medicine

## 2021-12-26 ENCOUNTER — Telehealth (HOSPITAL_COMMUNITY): Payer: Self-pay | Admitting: *Deleted

## 2021-12-26 NOTE — Telephone Encounter (Signed)
Attempted hospital discharge follow-up call. Left message for patient to return RN call. Deforest Hoyles, RN, 12/26/21, 1009 ?

## 2021-12-28 ENCOUNTER — Other Ambulatory Visit: Payer: Medicaid Other

## 2021-12-30 ENCOUNTER — Inpatient Hospital Stay (HOSPITAL_COMMUNITY): Payer: Medicaid Other

## 2022-01-26 ENCOUNTER — Encounter: Payer: Self-pay | Admitting: *Deleted

## 2022-02-26 ENCOUNTER — Ambulatory Visit (INDEPENDENT_AMBULATORY_CARE_PROVIDER_SITE_OTHER): Payer: Medicaid Other | Admitting: Family Medicine

## 2022-02-26 ENCOUNTER — Encounter: Payer: Self-pay | Admitting: Family Medicine

## 2022-02-26 DIAGNOSIS — L740 Miliaria rubra: Secondary | ICD-10-CM | POA: Insufficient documentation

## 2022-02-26 NOTE — Patient Instructions (Signed)
Thank you for coming to see me today. It was a pleasure. Today we discussed your rash, I think it is a a heat rash. I recommend staying hydrated, out of direct sunlight at midday, sunscreen daily, continue the hydrocortisone cream twice a day. Can do cool showers and aloe vera gel too.   Please follow-up with [place] as needed   If you have any questions or concerns, please do not hesitate to call the office at [phone].  Best wishes,   Dr Allena Katz

## 2022-02-26 NOTE — Assessment & Plan Note (Addendum)
Likely heat rash due to significant amount of prolonged sun exposure this week. Also considered hives/allergic reaction. Is it improved with hydrocortisone ointment which is reassuring.  Recommended staying hydrated given high temperatures, breathable clothing, staying out of direct sunlight during midday sun, applying sunscreen daily to face and sun exposed areas (patient to reapply every 2-3 hours when outside), continue the hydrocortisone cream twice a day until rash resolves.  Cool showers and applying aloe vera gel may help.  Follow-up as needed.

## 2022-02-26 NOTE — Progress Notes (Signed)
     SUBJECTIVE:   CHIEF COMPLAINT / HPI:   Tammie Edwards is a 21 y.o. female presents for follow up   Rash Pt reports a new, widespread pruritic rash started 5-6 days ago.  It is described as skin colored and bumpy.  Patient has been picking at the bumps, no obvious drainage. It initially started over forearms, spread up the arms onto abdomen, hips and legs.  Has been outside from 11am-6pm daily this week and she thought it was a heat rash/eczema.  She reports being bitten by mosquitoes when she was outside.  Has been applying hydrocortisone which has helped and rash is clearing up.  No fevers, new changes in diet, new medications, recent travel, changes in laundry detergent, soap etc.   Flowsheet Row Office Visit from 02/26/2022 in [facility]  PHQ-9 Total Score 0        PERTINENT  PMH / PSH: Asthma, trichomonas  OBJECTIVE:   BP (!) 98/58   Pulse (!) 59   Wt 106 lb (48.1 kg)   LMP 02/22/2022   SpO2 98%   BMI 20.70 kg/m    General: Alert, no acute distress Cardio: Well-perfused Pulm: normal work of breathing Neuro: Cranial nerves grossly intact  Skin: Warm and dry, skin colored resolving papular rash over forearms and abdomen  ASSESSMENT/PLAN:   Heat rash Likely heat rash due to significant amount of prolonged sun exposure this week. Also considered hives/allergic reaction. Is it improved with hydrocortisone ointment which is reassuring.  Recommended staying hydrated given high temperatures, breathable clothing, staying out of direct sunlight during midday sun, applying sunscreen daily to face and sun exposed areas (patient to reapply every 2-3 hours when outside), continue the hydrocortisone cream twice a day until rash resolves.  Cool showers and applying aloe vera gel may help.  Follow-up as needed.     Towanda Octave, MD PGY-3 [facility]

## 2022-09-23 IMAGING — US US OB COMP LESS 14 WK
1 series · 15 of 28 positions shown · non-contrast
Comparison: None.

CLINICAL DATA: Pregnant

EXAM:
OBSTETRIC <14 WK ULTRASOUND
TECHNIQUE: Transabdominal ultrasound was performed for evaluation of the
gestation as well as the maternal uterus and adnexal regions.

[Series 1: us ob comp less 14 wk · 15 of 44 slices shown]
[im 1/44]
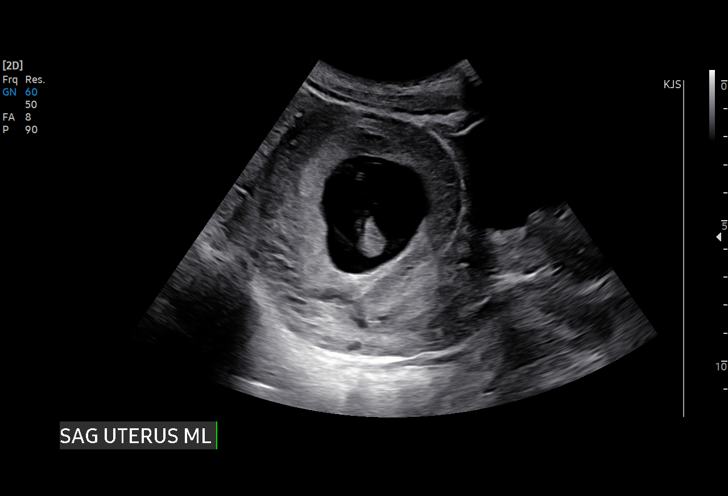
[im 4/44]
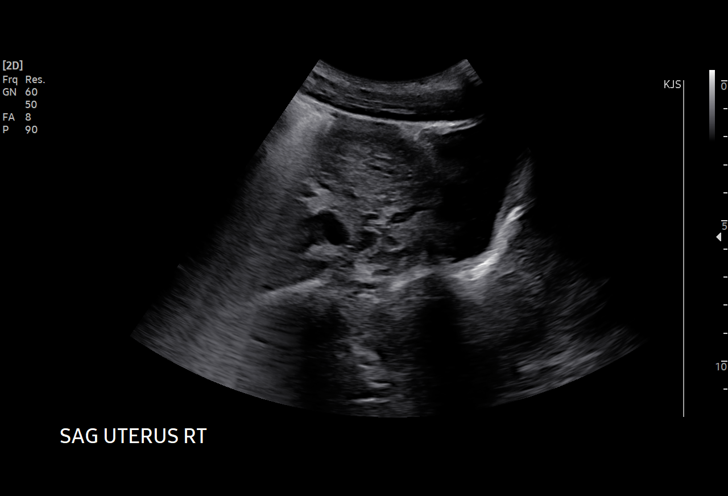
[im 7/44]
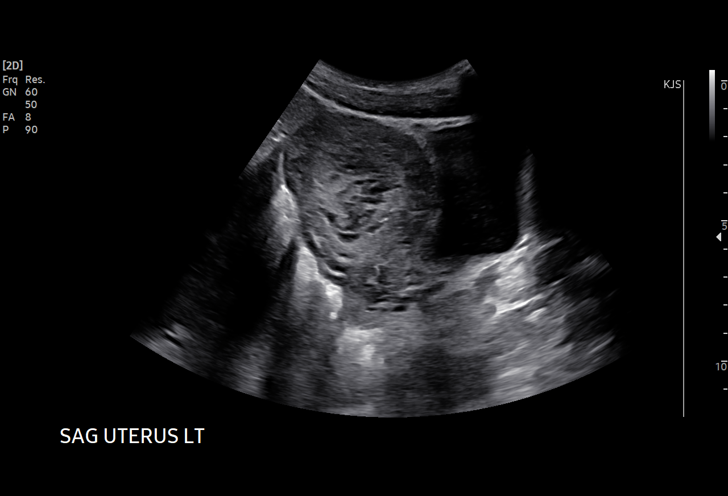
[im 10/44]
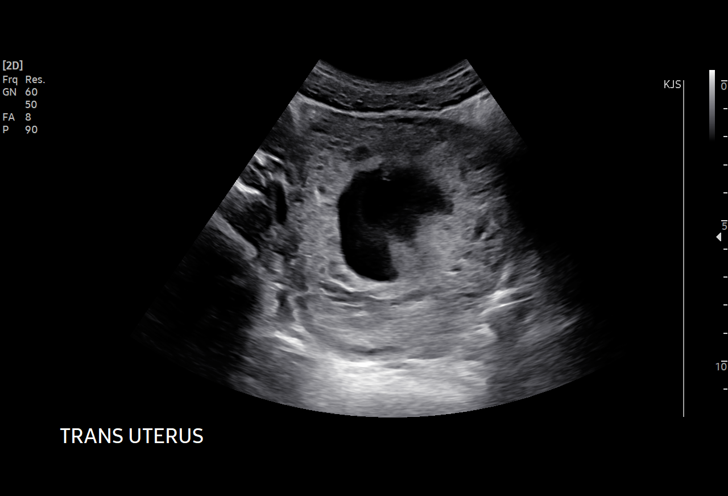
[im 13/44]
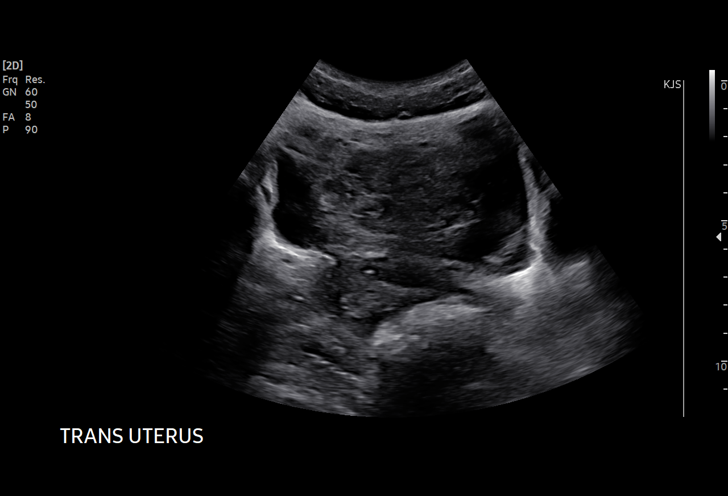
[im 16/44]
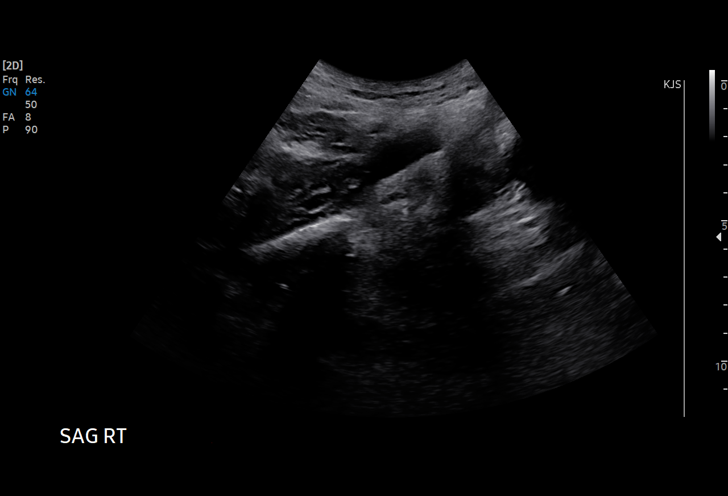
[im 20/44]
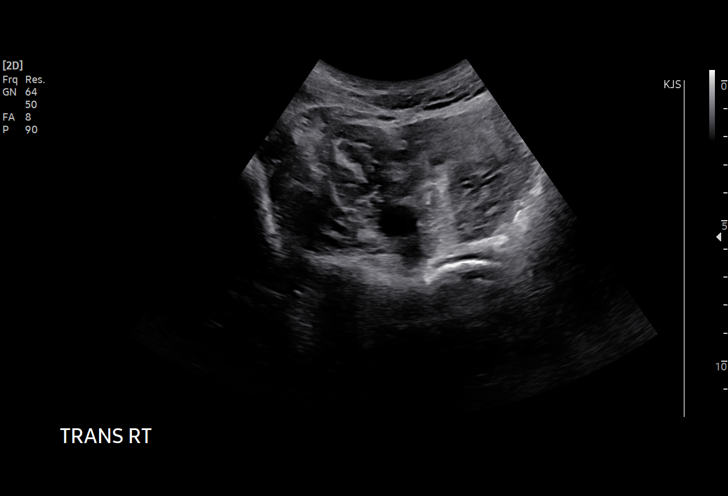
[im 23/44]
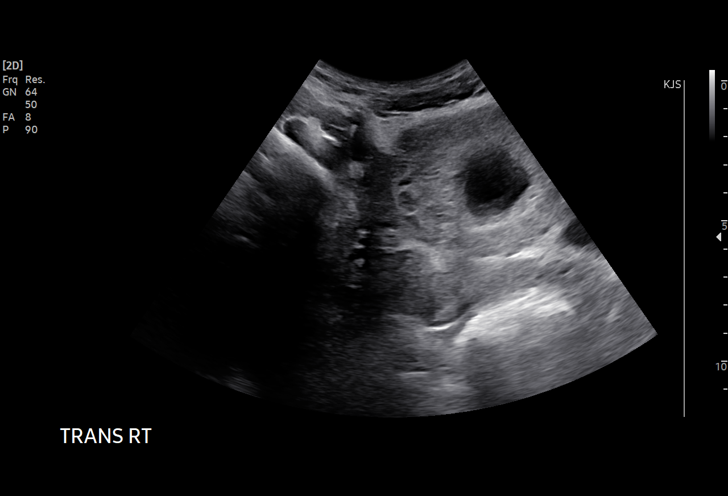
[im 24/44]
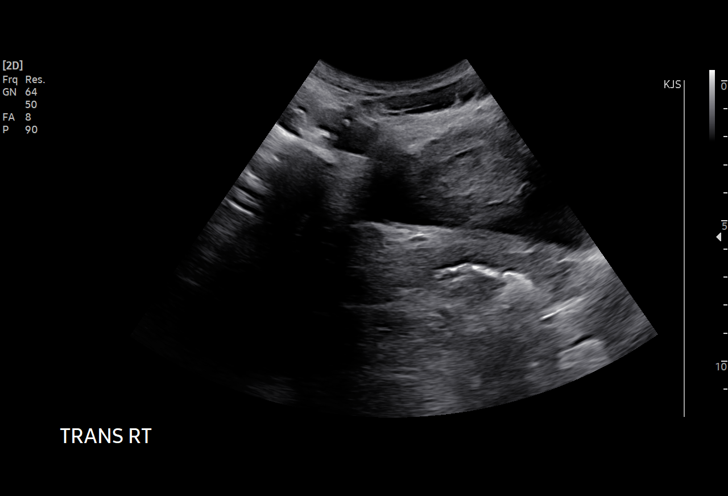
[im 28/44]
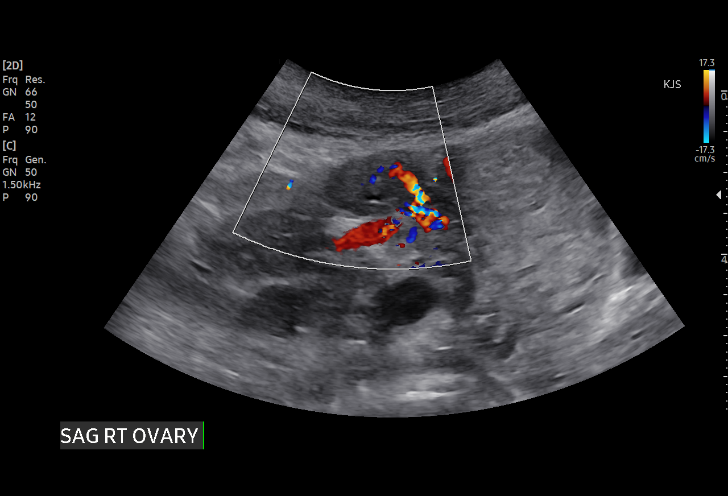
[im 31/44]
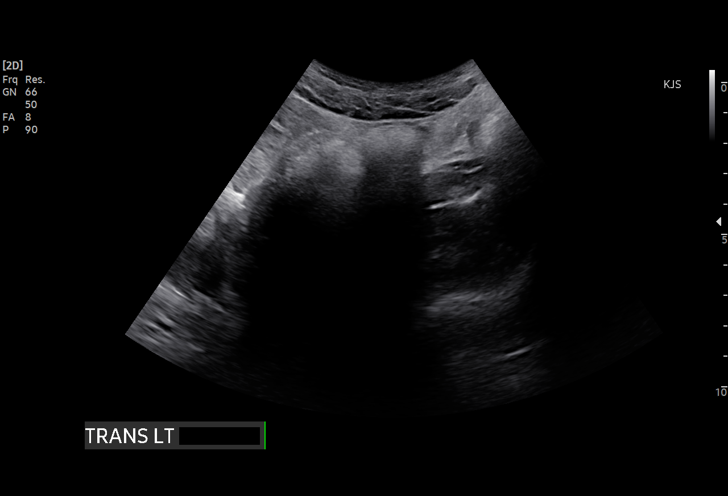
[im 34/44]
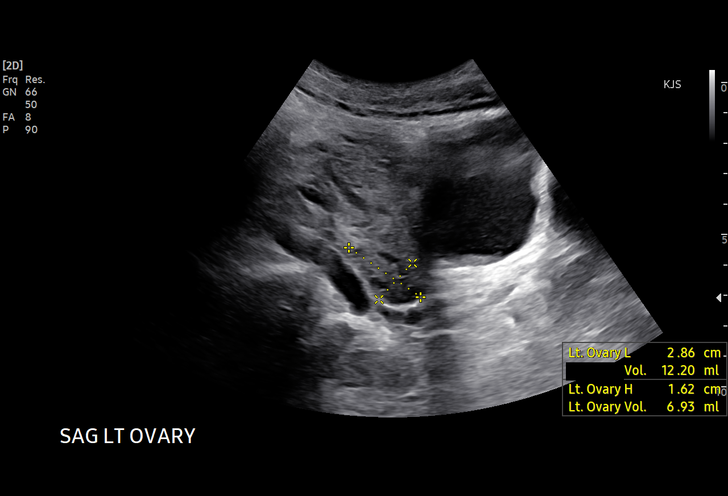
[im 37/44]
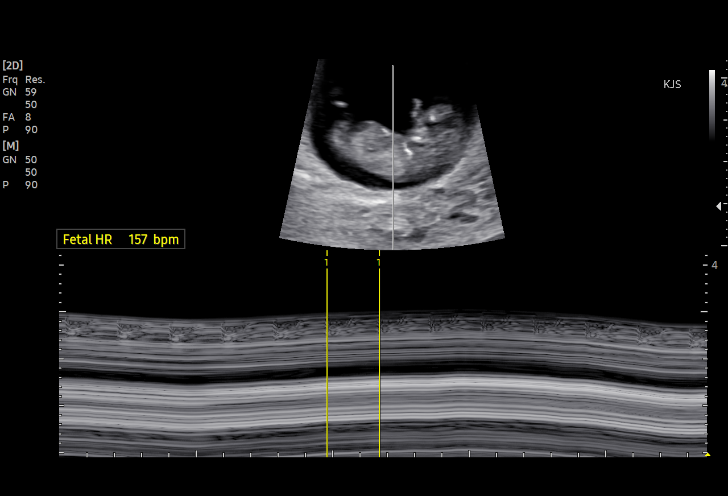
[im 40/44]
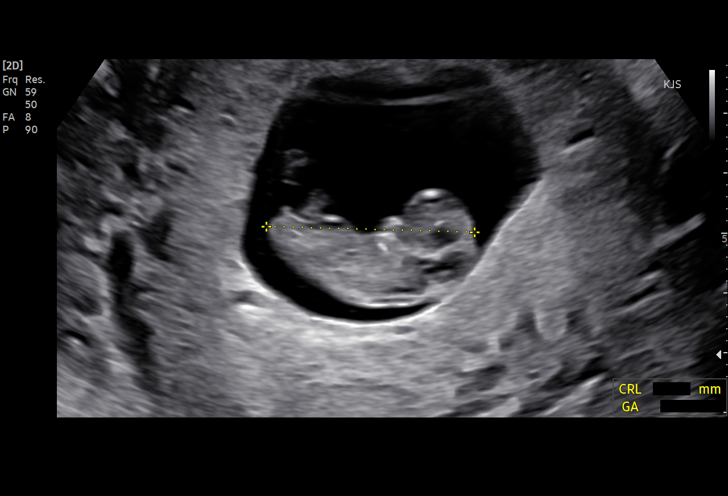
[im 44/44]
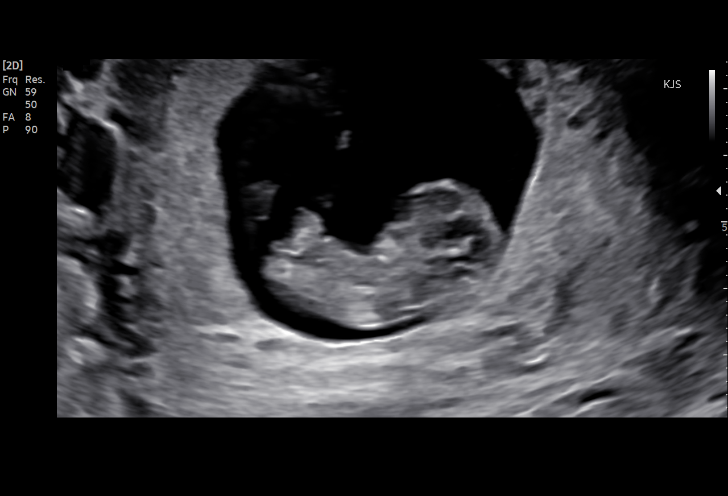

[15 of 28 positions shown; findings below may reference images not displayed]

FINDINGS: Intrauterine gestational sac: Single

Yolk sac:  Visualized.

Embryo:  Visualized.

Cardiac Activity: Visualized.

Heart Rate: 157 bpm

CRL: 34.5  mm   10 w 3 d                  US EDC: 12/19/2021

Subchorionic hemorrhage:  None visualized.

Maternal uterus/adnexae: Ovaries are normal. Right ovary measures
2.8 x 1.6 x 2.5 cm. Left ovary measures 2.9 x 1.6 x 1.9 cm. No free
fluid.
IMPRESSION: Single viable IUP with estimated sonographic age of 10 weeks 3 day
and US EDC 12/19/2021

## 2022-11-28 IMAGING — US US MFM OB DETAIL+14 WK
1 series · 13 of 28 positions shown · non-contrast
Comparison: none

[Series 1: us mfm ob detail+14 wk · 13 of 154 slices shown]
[im 6/154]
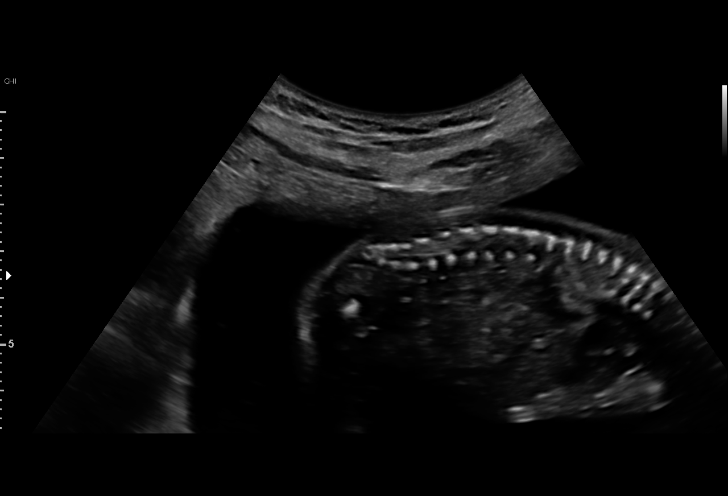
[im 18/154]
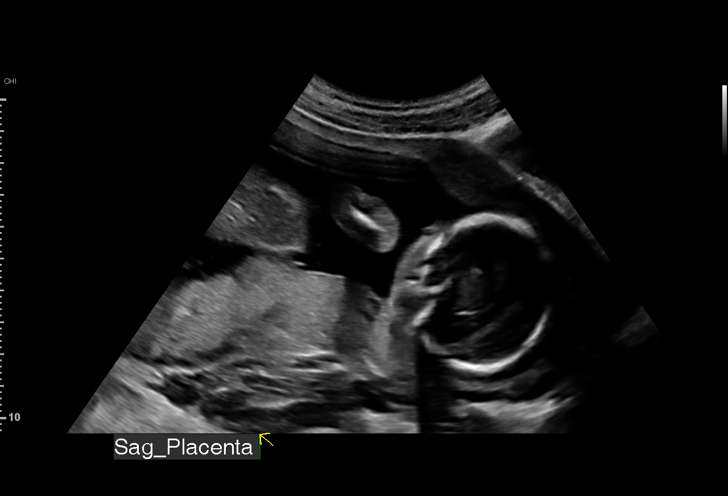
[im 29/154]
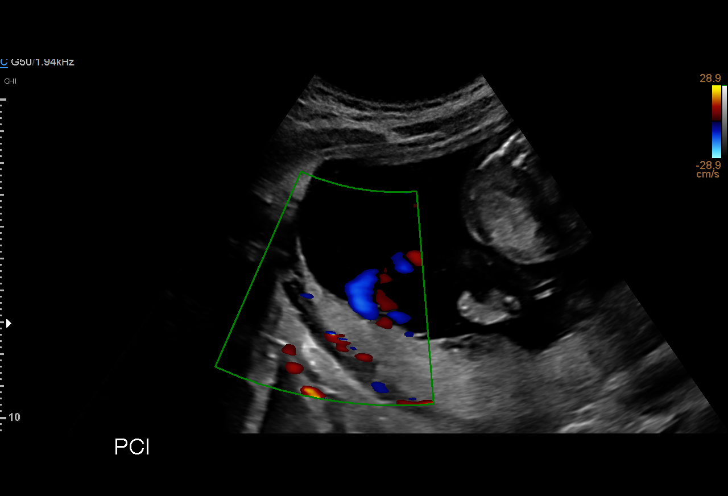
[im 40/154]
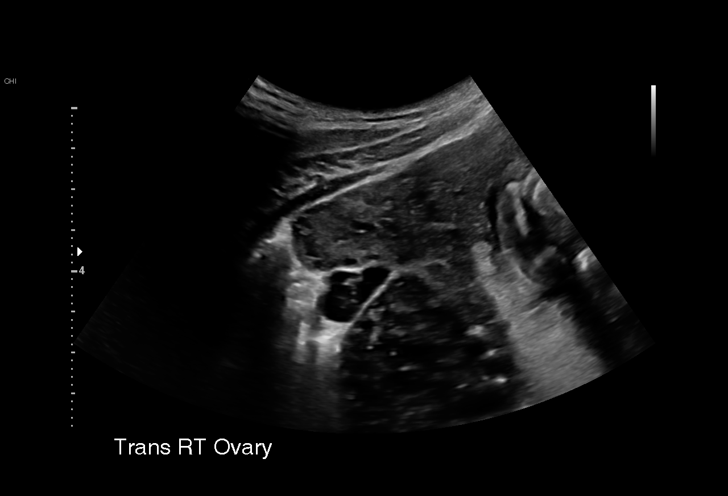
[im 52/154]
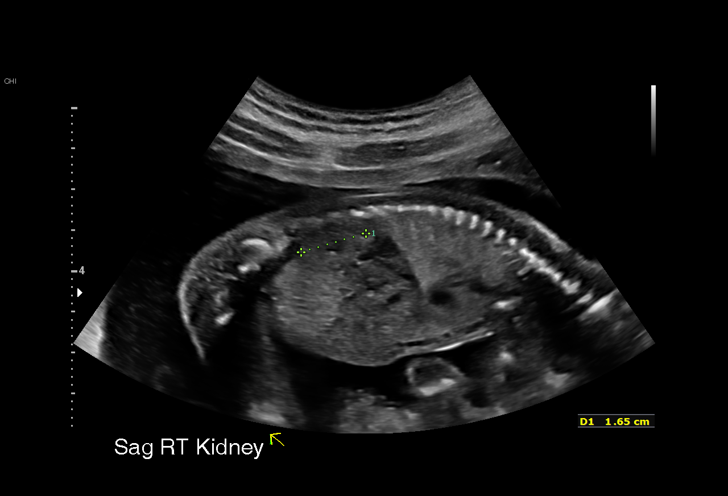
[im 63/154]
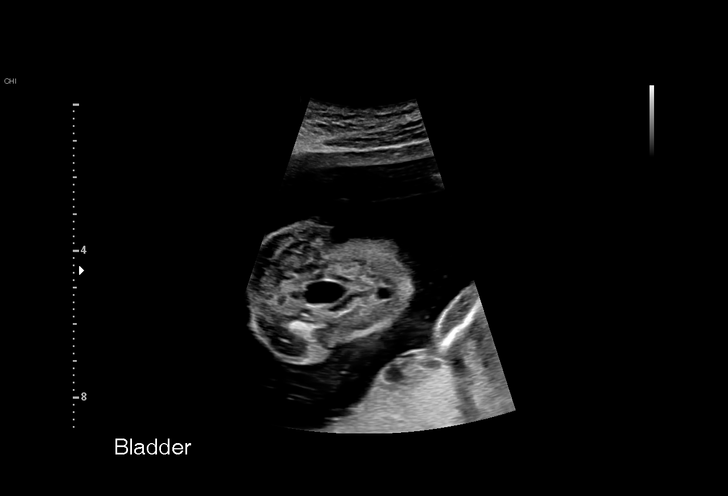
[im 80/154]
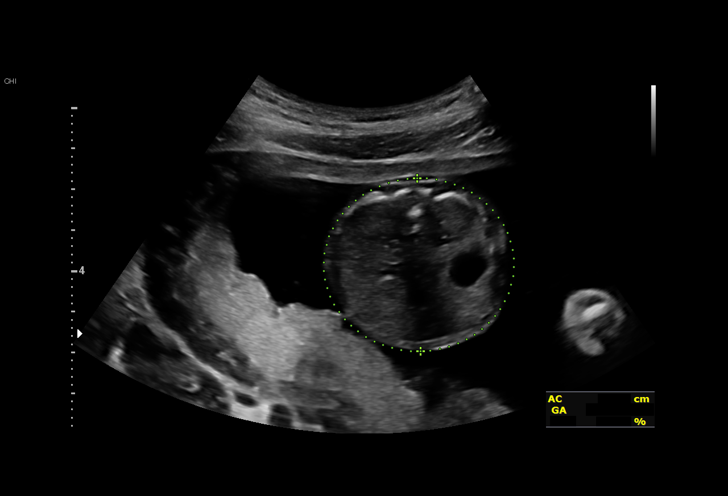
[im 91/154]
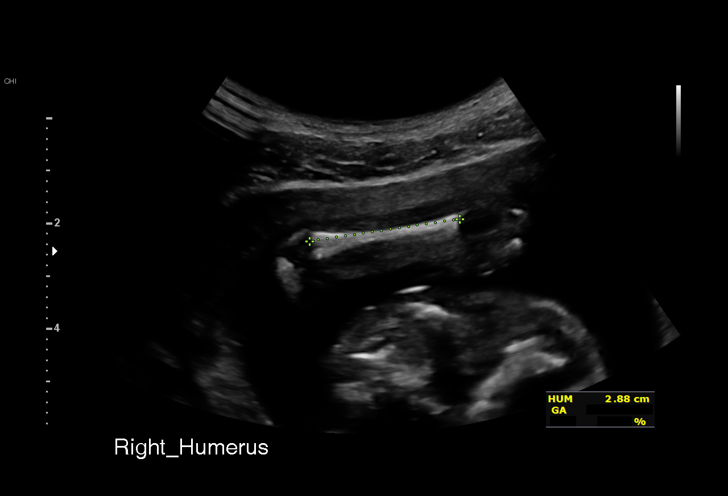
[im 103/154]
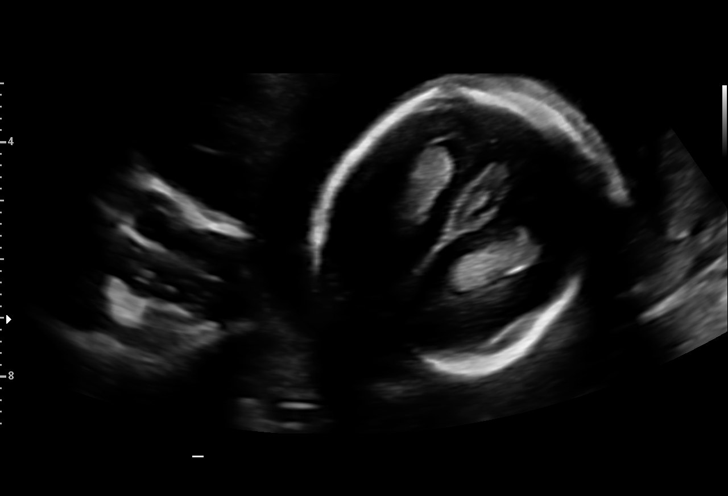
[im 114/154]
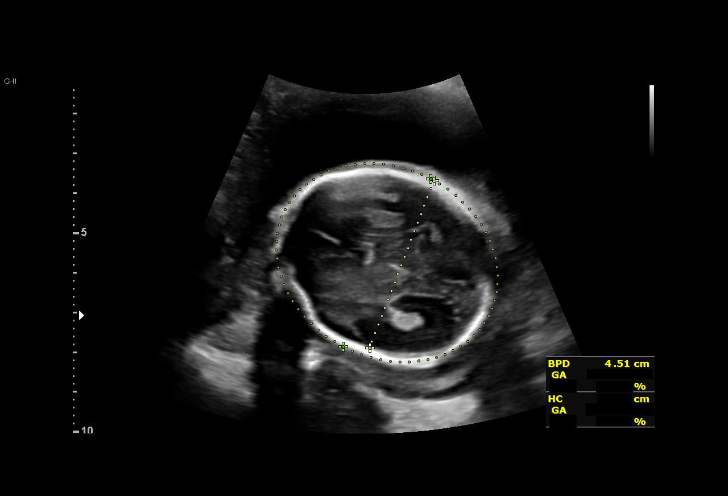
[im 125/154]
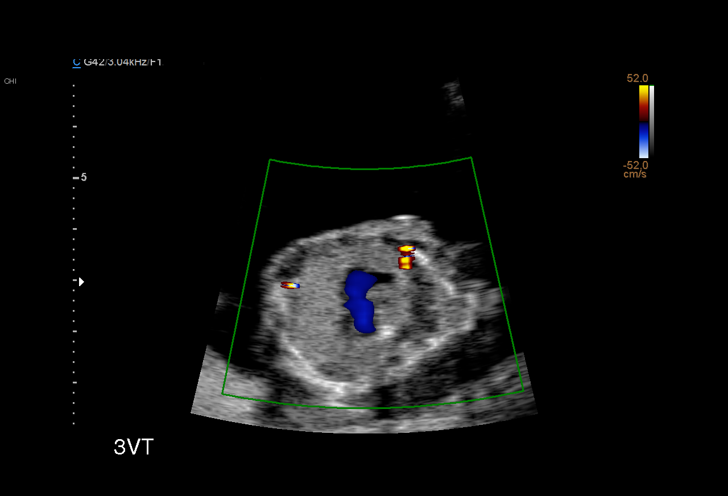
[im 137/154]
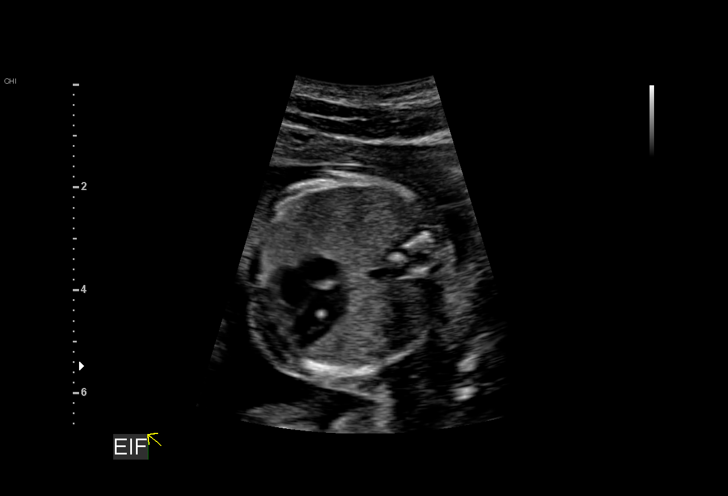
[im 148/154]
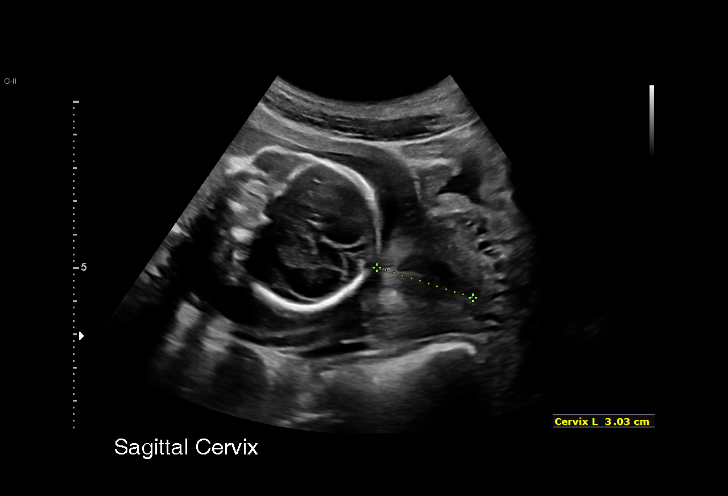

[13 of 28 positions shown; findings below may reference images not displayed]

FELNER

Indications

 19 weeks gestation of pregnancy
 Encounter for antenatal screening for
 malformations
 No genetic testing
 Echogenic intracardiac focus of the heart
 (EIF)
Fetal Evaluation

 Num Of Fetuses:         1
 Fetal Heart Rate(bpm):  147
 Cardiac Activity:       Observed
 Presentation:           Cephalic
 Placenta:               Posterior
 P. Cord Insertion:      Visualized, central

 Amniotic Fluid
 AFI FV:      Within normal limits

                             Largest Pocket(cm)

Biometry

 BPD:      45.2  mm     G. Age:  19w 5d         67  %    CI:        72.91   %    70 - 86
                                                         FL/HC:      17.3   %    16.1 -
 HC:      168.3  mm     G. Age:  19w 3d         52  %    HC/AC:      1.20        1.09 -
 AC:      139.9  mm     G. Age:  19w 3d         49  %    FL/BPD:     64.6   %
 FL:       29.2  mm     G. Age:  19w 0d         32  %    FL/AC:      20.9   %    20 - 24
 HUM:      29.1  mm     G. Age:  19w 3d         56  %
 CER:      19.4  mm     G. Age:  18w 6d         32  %
 NFT:       2.4  mm

 LV:        5.5  mm
 CM:        2.1  mm

 Est. FW:     281  gm    0 lb 10 oz      42  %
OB History

 Gravidity:    1
Gestational Age

 LMP:           19w 2d        Date:  03/18/21                 EDD:   12/23/21
 U/S Today:     19w 3d                                        EDD:   12/22/21
 Best:          19w 2d     Det. By:  LMP  (03/18/21)          EDD:   12/23/21
Anatomy

 Cranium:               Appears normal         LVOT:                   Appears normal
 Cavum:                 Appears normal         Aortic Arch:            Appears normal
 Ventricles:            Appears normal         Ductal Arch:            Appears normal
 Choroid Plexus:        Appears normal         Diaphragm:              Appears normal
 Cerebellum:            Appears normal         Stomach:                Appears normal, left
                                                                       sided
 Posterior Fossa:       Appears normal         Abdomen:                Appears normal
 Nuchal Fold:           Appears normal         Abdominal Wall:         Appears nml (cord
                                                                       insert, abd wall)
 Face:                  Appears normal         Cord Vessels:           Appears normal (3
                        (orbits and profile)                           vessel cord)
 Lips:                  Appears normal         Kidneys:                Appear normal
 Palate:                Not well visualized    Bladder:                Appears normal
 Thoracic:              Appears normal         Spine:                  Appears normal
 Heart:                 Appears normal; EIF    Upper Extremities:      Appears normal
 RVOT:                  Appears normal         Lower Extremities:      Appears normal

 Other:  Fetus appears to be female. Nasal bone and lenses visualized. VC,
         3VV and 3VTV visualized. Heels/feet and open hands/5th digits
         visualized.
Cervix Uterus Adnexa

 Cervix
 Length:           3.08  cm.
 Normal appearance by transabdominal scan.

 Uterus
 No abnormality visualized.

 Right Ovary
 Within normal limits.

 Left Ovary
 Within normal limits.

 Cul De Sac
 No free fluid seen.
 Adnexa
 No abnormality visualized.
Impression

 Single intrauterine pregnancy here for a detailed anatomy
 due to a finding of an echogenic intracardiac focus.
 Normal anatomy with measurements consistent with dates
 There is good fetal movement and amniotic fluid volume

 An echogenic intracardiac focus was observed today. I
 discussed that there is no increased risk to the function or
 structure of the heart. Ms. Kadda declined genetic screening
 previously. There were no additional markers of aneuploidy
 observed.  I reviewed that an ultrasound is a screening exam
 and that a diagnostic exam via amnioticenetesis is the only
 test available to provide a definitive result.

 At this time she declined any further testing including cell free
 DNA.

 All questions answered.
Recommendations

 Follow up as clinically indicated.

## 2023-01-11 ENCOUNTER — Ambulatory Visit: Payer: Medicaid Other | Admitting: Family Medicine

## 2023-01-11 ENCOUNTER — Ambulatory Visit: Payer: Self-pay

## 2023-01-12 ENCOUNTER — Other Ambulatory Visit (HOSPITAL_COMMUNITY)
Admission: RE | Admit: 2023-01-12 | Discharge: 2023-01-12 | Disposition: A | Discharge status: Home / Self Care | Payer: Medicaid Other | Source: Ambulatory Visit | Attending: Family Medicine | Admitting: Family Medicine

## 2023-01-12 ENCOUNTER — Ambulatory Visit (INDEPENDENT_AMBULATORY_CARE_PROVIDER_SITE_OTHER): Payer: Medicaid Other | Admitting: Student

## 2023-01-12 VITALS — BP 92/69 | HR 57 | Ht 61.61 in | Wt 102.8 lb

## 2023-01-12 DIAGNOSIS — N898 Other specified noninflammatory disorders of vagina: Secondary | ICD-10-CM

## 2023-01-12 DIAGNOSIS — B379 Candidiasis, unspecified: Secondary | ICD-10-CM | POA: Diagnosis not present

## 2023-01-12 LAB — POCT WET PREP (WET MOUNT)
Clue Cells Wet Prep Whiff POC: NEGATIVE
Trichomonas Wet Prep HPF POC: ABSENT

## 2023-01-12 MED ORDER — FLUCONAZOLE 150 MG PO TABS: 150.0000 mg | ORAL_TABLET | Freq: Once | ORAL | 0 refills | Status: DC

## 2023-01-12 MED ORDER — FLUCONAZOLE 150 MG PO TABS: 150.0000 mg | ORAL_TABLET | Freq: Once | ORAL | 0 refills | Status: AC

## 2023-01-12 NOTE — Patient Instructions (Addendum)
It was wonderful to meet you today. Thank you for allowing me to be a part of your care. Below is a short summary of what we discussed at your visit today:  Suspect the lump in your rectum is most likely cyst.  Exam showed about 2 mm lump that is not visible today but felt with palpation.  Generally these cysts will resolve on their own spontaneously.  We obtained lab to test you for STD including chlamydia, gonorrhea and trich  On exam you do have some white discharge is likely because likely due to Candida infection.  I have sent in a one-time dose of fluconazole 150 mg.  Please bring all of your medications to every appointment!  If you have any questions or concerns, please do not hesitate to contact us via phone or MyChart message.   Jerre Simon, MD [facility]

## 2023-01-12 NOTE — Progress Notes (Signed)
    SUBJECTIVE:   CHIEF COMPLAINT / HPI:   Patient is a 22 year old female presenting today for lump around her anus. She noticed the lump about 2-3 days ago.Tammie Edwards Has denied any fever, chills or pain with the lump.  Is the first time she is having this experience.  Sexually active denies any vaginal discharge, odor or dysuria.  PERTINENT  PMH / PSH: Reviewed   OBJECTIVE:   BP 92/69   Pulse (!) 57   Ht 5' 1.61" (1.565 m)   Wt 102 lb 12.8 oz (46.6 kg)   LMP 12/19/2022 (Exact Date)   SpO2 100%   BMI 19.04 kg/m    Physical Exam General: Alert, well appearing, NAD Cardiovascular: RRR, No Murmurs, Normal S2/S2 Respiratory: CTAB, No wheezing or Rales Pelvic:  Normal introitus for age, no external lesions, white copious vaginal discharge, No odor, mucosa pink and moist, no vaginal or cervical lesions. No visible mass over the anus but palpable 5mm lump 2'0clock around the anal ring  ASSESSMENT/PLAN:   Vaginal yeast infection Patient currently asymptomatic. Vaginal exam was consistent with keep use white discharge consistent with yeast infection.  Wet prep was positive for yeast infection and patient was treated with one-time dose of fluconazole 150 mg.  -Ordered STD testing for chlamydia, gonorrhea, trichomonas -Wet prep ordered -Rx Fluconazole 180 mg x 1  Anal mass Suspect mass around the anal ring to be likely due to cyst.  Low suspicion for abscess in the absence of fever or any infectious process at this time.  Provided reassurance and discussed return precautions with patient which she verbalized understanding and agreeable to.   Jerre Simon, MD [facility]

## 2023-01-14 LAB — CERVICOVAGINAL ANCILLARY ONLY
Chlamydia: NEGATIVE
Comment: NEGATIVE
Comment: NORMAL
Neisseria Gonorrhea: NEGATIVE

## 2023-02-26 ENCOUNTER — Encounter: Payer: Self-pay | Admitting: Family Medicine

## 2023-03-19 ENCOUNTER — Encounter: Payer: Self-pay | Admitting: Family Medicine

## 2024-01-01 ENCOUNTER — Encounter: Payer: Self-pay | Admitting: Family Medicine

## 2024-01-02 ENCOUNTER — Ambulatory Visit: Admitting: Family Medicine

## 2024-01-02 ENCOUNTER — Encounter: Payer: Self-pay | Admitting: Family Medicine

## 2024-01-02 VITALS — BP 90/60 | HR 70 | Ht 61.0 in | Wt 98.0 lb

## 2024-01-02 DIAGNOSIS — R630 Anorexia: Secondary | ICD-10-CM | POA: Diagnosis not present

## 2024-01-02 DIAGNOSIS — D509 Iron deficiency anemia, unspecified: Secondary | ICD-10-CM

## 2024-01-02 DIAGNOSIS — F419 Anxiety disorder, unspecified: Secondary | ICD-10-CM | POA: Diagnosis present

## 2024-01-02 MED ORDER — MIRTAZAPINE 7.5 MG PO TABS: 7.5000 mg | ORAL_TABLET | Freq: Every day | ORAL | 3 refills | Status: AC

## 2024-01-02 NOTE — Progress Notes (Signed)
   SUBJECTIVE:   CHIEF COMPLAINT / HPI:   Pelvic Pain Patient is a 23 y.o. female presenting for pelvic pain that she experienced once for about 5 minutes, a week after her period, which resolved on its own. Denies any trauma. She states she was last sexually active last night.  She endorses no vaginal odor or abnormal discharge.  She doesn't use contraception, has been with the same partner for 3 years.  LMP this month on 5/3-5/7 during which she has painful cramps (treated w/ Midol  or Goody powder), and heavy bleeding for the first two days, but otherwise has no concerns. Has a family history of fibroids.   Suppressed Appetite Difficulty gaining and maintaining weight s/p pregnancy. She feels like her appetite is suppressed and often feels nauseous after she eats. Some days she feels like eating and other days she doesn't. 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 She also feels like she has postpartum anger and feels like she has a short fuse. Works out and drinks protein daily to help with weight gain, but is struggling. Denies any SI/HI.   PERTINENT  PMH / PSH:  OBJECTIVE:   BP 90/60   Pulse 70   Ht 5\' 1"  (1.549 m)   Wt 98 lb (44.5 kg)   LMP 12/24/2023   SpO2 98%   BMI 18.52 kg/m   General: Awake and Alert in NAD HEENT: NCAT. Sclera anicteric. No rhinorrhea. Cardiovascular: RRR. No M/R/G Respiratory: CTAB, normal WOB on RA. No wheezing, crackles, rhonchi, or diminished breath sounds. Abdomen: Soft, non-tender, non-distended. Bowel sounds normoactive Extremities: Able to move all extremities. No BLE edema, no deformities or significant joint findings. Skin: Warm and dry. No abrasions or rashes noted. Neuro: No focal neurological deficits.  ASSESSMENT/PLAN:   Assessment & Plan Moderate anxiety GAD-7 score of 14 today.  This has been aggravated since after her pregnancy.  At the end of her visit she also shared that she smokes marijuana which she declined  earlier to this morning question since she thought it was just tobacco. - Advised patient to decrease marijuana use - Prescribed mirtazapine 7.5 mg daily to help with anxiety and appetite suppression Decreased appetite Appetite suppression likely due to anxiety vs marijuana use. - Advised decreasing marijuana use - Prescribed mirtazapine 7.5 mg daily to help with anxiety and appetite suppression - BMP ordered to check electrolytes Iron deficiency anemia, unspecified iron deficiency anemia type History of iron deficiency anemia.  Last hemoglobin was 10 and 2023. - Anemia profile B ordered today  [name], DO [facility]

## 2024-01-02 NOTE — Patient Instructions (Addendum)
 It was great to see you today! Thank you for choosing [facility] for your primary care. Tammie Edwards was seen for pelvic pain.  Today we addressed: Pelvic Pain: We will watch and monitor if you have more episodes of this pelvic pain.  Potentially could be mittelschmerz which is usually pain with ovulation.  Could consider further workup with ultrasound or discuss contraception options in the future if this persists Suppressed Appetite: Due to the appetite suppression and anxiety I will prescribe you Mirtazapine 7.5 mg to take daily.  We will also check anemia panel, kidney function, and electrolytes to make sure everything is stable.  We are checking some labs today. I will send you a MyChart message with your results, per your preference. If you do not hear about your labs in the next 2 weeks, please call the office.  You should return to our clinic Return if symptoms worsen or fail to improve. Please arrive 15 minutes before your appointment to ensure smooth check in process.  We appreciate your efforts in making this happen.  Thank you for allowing me to participate in your care, [name], DO 01/02/2024, 2:42 PM PGY-1, [facility]

## 2024-01-03 ENCOUNTER — Ambulatory Visit: Payer: Self-pay | Admitting: Family Medicine

## 2024-01-03 LAB — ANEMIA PROFILE B
Basophils Absolute: 0 10*3/uL (ref 0.0–0.2)
Basos: 0 %
EOS (ABSOLUTE): 0.1 10*3/uL (ref 0.0–0.4)
Eos: 1 %
Ferritin: 33 ng/mL (ref 15–150)
Folate: 9.1 ng/mL (ref >3.0)
Hematocrit: 41 % (ref 34.0–46.6)
Hemoglobin: 12.9 g/dL (ref 11.1–15.9)
Immature Grans (Abs): 0 10*3/uL (ref 0.0–0.1)
Immature Granulocytes: 0 %
Iron Saturation: 16 % (ref 15–55)
Iron: 47 ug/dL (ref 27–159)
Lymphocytes Absolute: 2.1 10*3/uL (ref 0.7–3.1)
Lymphs: 26 %
MCH: 30.9 pg (ref 26.6–33.0)
MCHC: 31.5 g/dL (ref 31.5–35.7)
MCV: 98 fL — ABNORMAL HIGH (ref 79–97)
Monocytes Absolute: 0.4 10*3/uL (ref 0.1–0.9)
Monocytes: 5 %
Neutrophils Absolute: 5.3 10*3/uL (ref 1.4–7.0)
Neutrophils: 68 %
Platelets: 282 10*3/uL (ref 150–450)
RBC: 4.18 x10E6/uL (ref 3.77–5.28)
RDW: 11.8 % (ref 11.7–15.4)
Retic Ct Pct: 1.5 % (ref 0.6–2.6)
Total Iron Binding Capacity: 288 ug/dL (ref 250–450)
UIBC: 241 ug/dL (ref 131–425)
Vitamin B-12: 456 pg/mL (ref 232–1245)
WBC: 7.9 10*3/uL (ref 3.4–10.8)

## 2024-01-03 LAB — BASIC METABOLIC PANEL WITH GFR
BUN/Creatinine Ratio: 22 (ref 9–23)
BUN: 19 mg/dL (ref 6–20)
CO2: 21 mmol/L (ref 20–29)
Calcium: 9.7 mg/dL (ref 8.7–10.2)
Chloride: 102 mmol/L (ref 96–106)
Creatinine, Ser: 0.88 mg/dL (ref 0.57–1.00)
Glucose: 70 mg/dL (ref 70–99)
Potassium: 4.1 mmol/L (ref 3.5–5.2)
Sodium: 139 mmol/L (ref 134–144)
eGFR: 96 mL/min/{1.73_m2} (ref >59)

## 2024-01-17 ENCOUNTER — Ambulatory Visit (INDEPENDENT_AMBULATORY_CARE_PROVIDER_SITE_OTHER)

## 2024-01-17 ENCOUNTER — Ambulatory Visit: Payer: Self-pay | Admitting: Student

## 2024-01-17 ENCOUNTER — Other Ambulatory Visit (HOSPITAL_COMMUNITY)
Admission: RE | Admit: 2024-01-17 | Discharge: 2024-01-17 | Disposition: A | Discharge status: Home / Self Care | Source: Ambulatory Visit | Attending: Family Medicine | Admitting: Family Medicine

## 2024-01-17 VITALS — BP 99/66 | HR 71 | Ht 61.0 in | Wt 97.5 lb

## 2024-01-17 DIAGNOSIS — N898 Other specified noninflammatory disorders of vagina: Secondary | ICD-10-CM | POA: Insufficient documentation

## 2024-01-17 DIAGNOSIS — Z113 Encounter for screening for infections with a predominantly sexual mode of transmission: Secondary | ICD-10-CM | POA: Insufficient documentation

## 2024-01-17 DIAGNOSIS — B3731 Acute candidiasis of vulva and vagina: Secondary | ICD-10-CM | POA: Insufficient documentation

## 2024-01-17 DIAGNOSIS — R110 Nausea: Secondary | ICD-10-CM | POA: Diagnosis not present

## 2024-01-17 LAB — POCT WET PREP (WET MOUNT)
Clue Cells Wet Prep Whiff POC: NEGATIVE
Trichomonas Wet Prep HPF POC: ABSENT

## 2024-01-17 MED ORDER — FLUCONAZOLE 150 MG PO TABS: 150.0000 mg | ORAL_TABLET | Freq: Once | ORAL | 0 refills | Status: AC

## 2024-01-17 NOTE — Patient Instructions (Signed)
 It was great to see you! Thank you for allowing me to participate in your care!    Our plans for today:  - fluconazole  sent to pharmacy for yeast infection  - If nausea and vomiting persist and if pain returns, please return or go to ED if severe   Take care and seek immediate care sooner if you develop any concerns.   Dr. Glenn Lange, DO [facility]

## 2024-01-17 NOTE — Progress Notes (Signed)
    SUBJECTIVE:   CHIEF COMPLAINT / HPI:   The patient is a 23 year old who presents with resolved intermittent pelvic pain, intermittent nausea and increased vaginal discharge.  Intermittent pelvic pain is described as a 'twinge' or 'sting' located midline but slightly to the right, lasting about ten seconds. This occurred on and off for two days last week with no recurrence since. Nausea has been present on and off daily for about two weeks, with one episode of vomiting last week but not since. No current nausea.  No fevers or other sick symptoms. They reports with daily use of marijuana since age 54, increasing since living independently of parents.  Their last menstrual period was on May 3rd, and they are not using any form of birth control. There is a possibility of pregnancy due to unprotected intercourse with their fianc. They have noticed increased vaginal discharge for about a week, described as thick and white with an abnormal odor, and associated with itching. No recent antibiotic use.  They would like STD testing.   PERTINENT  PMH / PSH: Anxiety, history of trichomonas  OBJECTIVE:   BP 99/66   Pulse 71   Ht 5\' 1"  (1.549 m)   Wt 97 lb 8 oz (44.2 kg)   LMP 12/24/2023   SpO2 100%   BMI 18.42 kg/m    General: NAD, pleasant, able to participate in exam Cardiac: RRR, no murmurs. Respiratory: Normal effort Abdomen: Bowel sounds present, nontender, nondistended, soft Extremities: no edema or cyanosis. Skin: warm and dry, no rashes noted GU: Chaperoned by CMA.  Normal external female genitalia, moist pink vaginal mucosa, cervix tilted downward, suspect retroverted uterus, could not visualize the os, no CMT, copious amounts of thick white discharge Neuro: alert, no obvious focal deficits Psych: Normal affect and mood  ASSESSMENT/PLAN:   Vaginal yeast infection Wet prep positive for vaginal yeast infection, consistent with symptoms and exam. - Rx fluconazole   Screen for  STD (sexually transmitted disease) Patient has low concern for STD but would like routine screening. -Testing for gonorrhea and chlamydia added to Pap - Wet prep negative for trichomonas - HIV and RPR - She declines birth control at this time  Nausea Intermittent nausea and single vomiting episode over the past 2 weeks.  LMP earlier this month, would not expect symptoms of pregnancy to start this early.  Could be related to chronic marijuana use.  Reassuringly, abdominal exam is benign and she is no longer nauseous or having pelvic pain. - Advise pregnancy test if period missed. - Discussed marijuana hyperemesis syndrome; recommend cessation of marijuana use to see if this helps - If symptoms return, she is advised to return for further evaluation      Dr. Glenn Lange, DO [facility]

## 2024-01-17 NOTE — Assessment & Plan Note (Addendum)
 Patient has low concern for STD but would like routine screening. -Testing for gonorrhea and chlamydia added to Pap - Wet prep negative for trichomonas - HIV and RPR - She declines birth control at this time

## 2024-01-17 NOTE — Assessment & Plan Note (Addendum)
 Intermittent nausea and single vomiting episode over the past 2 weeks.  LMP earlier this month, would not expect symptoms of pregnancy to start this early.  Could be related to chronic marijuana use.  Reassuringly, abdominal exam is benign and she is no longer nauseous or having pelvic pain. - Advise pregnancy test if period missed. - Discussed marijuana hyperemesis syndrome; recommend cessation of marijuana use to see if this helps - If symptoms return, she is advised to return for further evaluation

## 2024-01-17 NOTE — Assessment & Plan Note (Signed)
 Wet prep positive for vaginal yeast infection, consistent with symptoms and exam. - Rx fluconazole 

## 2024-01-18 LAB — CYTOLOGY - PAP
Chlamydia: NEGATIVE
Comment: NEGATIVE
Comment: NORMAL
Diagnosis: NEGATIVE
Neisseria Gonorrhea: NEGATIVE

## 2024-09-15 ENCOUNTER — Encounter: Payer: Self-pay | Admitting: Family Medicine

## 2024-09-18 MED ORDER — DESONIDE 0.05 % EX CREA: TOPICAL_CREAM | CUTANEOUS | 2 refills | Status: AC
